# Patient Record
Sex: Male | Born: 1955
Health system: Southern US, Community
[De-identification: ages and names within clinical notes are randomized; demographics above are authoritative.]

## PROBLEM LIST (undated history)

## (undated) DIAGNOSIS — T7840XA Allergy, unspecified, initial encounter: Secondary | ICD-10-CM

## (undated) DIAGNOSIS — M1990 Unspecified osteoarthritis, unspecified site: Secondary | ICD-10-CM

## (undated) HISTORY — PX: COLONOSCOPY: SHX174

## (undated) HISTORY — PX: POLYPECTOMY: SHX149

## (undated) HISTORY — DX: Allergy, unspecified, initial encounter: T78.40XA

## (undated) HISTORY — PX: NO PAST SURGERIES: SHX2092

---

## 2015-12-10 DIAGNOSIS — Z1389 Encounter for screening for other disorder: Secondary | ICD-10-CM | POA: Diagnosis not present

## 2015-12-10 DIAGNOSIS — N401 Enlarged prostate with lower urinary tract symptoms: Secondary | ICD-10-CM | POA: Diagnosis not present

## 2015-12-10 DIAGNOSIS — Z0000 Encounter for general adult medical examination without abnormal findings: Secondary | ICD-10-CM | POA: Diagnosis not present

## 2015-12-10 DIAGNOSIS — Z9181 History of falling: Secondary | ICD-10-CM | POA: Diagnosis not present

## 2015-12-10 DIAGNOSIS — Z6837 Body mass index (BMI) 37.0-37.9, adult: Secondary | ICD-10-CM | POA: Diagnosis not present

## 2015-12-10 DIAGNOSIS — E669 Obesity, unspecified: Secondary | ICD-10-CM | POA: Diagnosis not present

## 2015-12-10 DIAGNOSIS — N529 Male erectile dysfunction, unspecified: Secondary | ICD-10-CM | POA: Diagnosis not present

## 2015-12-10 DIAGNOSIS — Z800 Family history of malignant neoplasm of digestive organs: Secondary | ICD-10-CM | POA: Diagnosis not present

## 2016-02-08 DIAGNOSIS — Z1211 Encounter for screening for malignant neoplasm of colon: Secondary | ICD-10-CM | POA: Diagnosis not present

## 2016-02-08 DIAGNOSIS — Z800 Family history of malignant neoplasm of digestive organs: Secondary | ICD-10-CM | POA: Diagnosis not present

## 2016-04-14 DIAGNOSIS — Z86010 Personal history of colonic polyps: Secondary | ICD-10-CM | POA: Diagnosis not present

## 2016-04-14 DIAGNOSIS — K648 Other hemorrhoids: Secondary | ICD-10-CM | POA: Diagnosis not present

## 2016-04-14 DIAGNOSIS — Z800 Family history of malignant neoplasm of digestive organs: Secondary | ICD-10-CM | POA: Diagnosis not present

## 2016-04-14 DIAGNOSIS — Z1211 Encounter for screening for malignant neoplasm of colon: Secondary | ICD-10-CM | POA: Diagnosis not present

## 2016-04-14 DIAGNOSIS — K5730 Diverticulosis of large intestine without perforation or abscess without bleeding: Secondary | ICD-10-CM | POA: Diagnosis not present

## 2016-04-14 HISTORY — PX: COLONOSCOPY: SHX174

## 2016-06-02 DIAGNOSIS — L821 Other seborrheic keratosis: Secondary | ICD-10-CM | POA: Diagnosis not present

## 2016-06-02 DIAGNOSIS — I781 Nevus, non-neoplastic: Secondary | ICD-10-CM | POA: Diagnosis not present

## 2016-06-02 DIAGNOSIS — L578 Other skin changes due to chronic exposure to nonionizing radiation: Secondary | ICD-10-CM | POA: Diagnosis not present

## 2016-06-09 DIAGNOSIS — N401 Enlarged prostate with lower urinary tract symptoms: Secondary | ICD-10-CM | POA: Diagnosis not present

## 2016-06-09 DIAGNOSIS — R635 Abnormal weight gain: Secondary | ICD-10-CM | POA: Diagnosis not present

## 2016-06-09 DIAGNOSIS — N529 Male erectile dysfunction, unspecified: Secondary | ICD-10-CM | POA: Diagnosis not present

## 2016-06-09 DIAGNOSIS — K429 Umbilical hernia without obstruction or gangrene: Secondary | ICD-10-CM | POA: Diagnosis not present

## 2016-06-09 DIAGNOSIS — E669 Obesity, unspecified: Secondary | ICD-10-CM | POA: Diagnosis not present

## 2016-06-09 DIAGNOSIS — Z125 Encounter for screening for malignant neoplasm of prostate: Secondary | ICD-10-CM | POA: Diagnosis not present

## 2016-06-24 DIAGNOSIS — K429 Umbilical hernia without obstruction or gangrene: Secondary | ICD-10-CM | POA: Diagnosis not present

## 2016-07-04 DIAGNOSIS — E119 Type 2 diabetes mellitus without complications: Secondary | ICD-10-CM | POA: Diagnosis not present

## 2016-07-04 DIAGNOSIS — E785 Hyperlipidemia, unspecified: Secondary | ICD-10-CM | POA: Diagnosis not present

## 2016-07-04 DIAGNOSIS — I10 Essential (primary) hypertension: Secondary | ICD-10-CM | POA: Diagnosis not present

## 2016-09-04 MED FILL — CIALIS 5 MG TABLET: 5 | 30 days supply | Qty: 30 | Fill #0

## 2016-12-25 DIAGNOSIS — N401 Enlarged prostate with lower urinary tract symptoms: Secondary | ICD-10-CM | POA: Diagnosis not present

## 2016-12-25 DIAGNOSIS — Z0000 Encounter for general adult medical examination without abnormal findings: Secondary | ICD-10-CM | POA: Diagnosis not present

## 2016-12-25 DIAGNOSIS — R635 Abnormal weight gain: Secondary | ICD-10-CM | POA: Diagnosis not present

## 2016-12-25 DIAGNOSIS — Z1389 Encounter for screening for other disorder: Secondary | ICD-10-CM | POA: Diagnosis not present

## 2016-12-25 DIAGNOSIS — N529 Male erectile dysfunction, unspecified: Secondary | ICD-10-CM | POA: Diagnosis not present

## 2016-12-25 DIAGNOSIS — Z6836 Body mass index (BMI) 36.0-36.9, adult: Secondary | ICD-10-CM | POA: Diagnosis not present

## 2017-01-23 DIAGNOSIS — M25562 Pain in left knee: Secondary | ICD-10-CM | POA: Diagnosis not present

## 2017-01-27 ENCOUNTER — Other Ambulatory Visit (HOSPITAL_COMMUNITY): Payer: Self-pay | Admitting: Internal Medicine

## 2017-01-27 ENCOUNTER — Ambulatory Visit (HOSPITAL_COMMUNITY)
Admission: RE | Admit: 2017-01-27 | Discharge: 2017-01-27 | Disposition: A | Discharge status: Home / Self Care | Payer: 59 | Source: Ambulatory Visit | Attending: Internal Medicine | Admitting: Internal Medicine

## 2017-01-27 DIAGNOSIS — M85862 Other specified disorders of bone density and structure, left lower leg: Secondary | ICD-10-CM | POA: Insufficient documentation

## 2017-01-27 DIAGNOSIS — M25562 Pain in left knee: Secondary | ICD-10-CM | POA: Insufficient documentation

## 2017-01-27 DIAGNOSIS — M179 Osteoarthritis of knee, unspecified: Secondary | ICD-10-CM | POA: Diagnosis not present

## 2017-01-27 DIAGNOSIS — M25462 Effusion, left knee: Secondary | ICD-10-CM | POA: Insufficient documentation

## 2017-06-26 ENCOUNTER — Telehealth: Payer: Self-pay

## 2017-06-26 NOTE — Telephone Encounter (Signed)
Requesting date for next colonoscopy.  Please advise.  Thank you.

## 2017-06-30 NOTE — Telephone Encounter (Signed)
Rpt colon Jan 2023 Last one was on 04/14/2016

## 2017-07-01 NOTE — Telephone Encounter (Signed)
I left a message for the patient with the date of his next colonoscopy.  A recall was also entered in [facility].

## 2017-07-28 DIAGNOSIS — Z0000 Encounter for general adult medical examination without abnormal findings: Secondary | ICD-10-CM | POA: Diagnosis not present

## 2017-07-28 DIAGNOSIS — N529 Male erectile dysfunction, unspecified: Secondary | ICD-10-CM | POA: Diagnosis not present

## 2017-07-28 DIAGNOSIS — R635 Abnormal weight gain: Secondary | ICD-10-CM | POA: Diagnosis not present

## 2017-07-28 DIAGNOSIS — Z6837 Body mass index (BMI) 37.0-37.9, adult: Secondary | ICD-10-CM | POA: Diagnosis not present

## 2017-07-28 DIAGNOSIS — N401 Enlarged prostate with lower urinary tract symptoms: Secondary | ICD-10-CM | POA: Diagnosis not present

## 2017-09-03 DIAGNOSIS — J208 Acute bronchitis due to other specified organisms: Secondary | ICD-10-CM | POA: Diagnosis not present

## 2018-01-28 DIAGNOSIS — Z0000 Encounter for general adult medical examination without abnormal findings: Secondary | ICD-10-CM | POA: Diagnosis not present

## 2018-01-28 DIAGNOSIS — E669 Obesity, unspecified: Secondary | ICD-10-CM | POA: Diagnosis not present

## 2018-01-28 DIAGNOSIS — R635 Abnormal weight gain: Secondary | ICD-10-CM | POA: Diagnosis not present

## 2018-01-28 DIAGNOSIS — N401 Enlarged prostate with lower urinary tract symptoms: Secondary | ICD-10-CM | POA: Diagnosis not present

## 2018-01-28 DIAGNOSIS — M170 Bilateral primary osteoarthritis of knee: Secondary | ICD-10-CM | POA: Diagnosis not present

## 2018-01-28 DIAGNOSIS — Z1331 Encounter for screening for depression: Secondary | ICD-10-CM | POA: Diagnosis not present

## 2018-01-28 DIAGNOSIS — N529 Male erectile dysfunction, unspecified: Secondary | ICD-10-CM | POA: Diagnosis not present

## 2018-01-28 DIAGNOSIS — Z1339 Encounter for screening examination for other mental health and behavioral disorders: Secondary | ICD-10-CM | POA: Diagnosis not present

## 2018-03-03 MED FILL — CELECOXIB 200 MG CAP: 200 | 90 days supply | Qty: 180 | Fill #0

## 2018-06-17 MED FILL — CELECOXIB 200 MG CAPSULE: 200 | 90 days supply | Qty: 180 | Fill #0

## 2018-09-17 MED FILL — CELECOXIB 200 MG CAP: 200 | 90 days supply | Qty: 180 | Fill #0

## 2018-10-27 DIAGNOSIS — N529 Male erectile dysfunction, unspecified: Secondary | ICD-10-CM | POA: Diagnosis not present

## 2018-10-27 DIAGNOSIS — Z0000 Encounter for general adult medical examination without abnormal findings: Secondary | ICD-10-CM | POA: Diagnosis not present

## 2018-10-27 DIAGNOSIS — N401 Enlarged prostate with lower urinary tract symptoms: Secondary | ICD-10-CM | POA: Diagnosis not present

## 2018-10-27 DIAGNOSIS — M170 Bilateral primary osteoarthritis of knee: Secondary | ICD-10-CM | POA: Diagnosis not present

## 2018-10-27 DIAGNOSIS — R635 Abnormal weight gain: Secondary | ICD-10-CM | POA: Diagnosis not present

## 2018-10-27 DIAGNOSIS — R7301 Impaired fasting glucose: Secondary | ICD-10-CM | POA: Diagnosis not present

## 2019-01-06 MED FILL — CELECOXIB 200 MG CAP: 200 | 90 days supply | Qty: 180 | Fill #0

## 2019-01-17 MED FILL — CELECOXIB 200 MG CAP: 200 | 90 days supply | Qty: 180 | Fill #0

## 2019-04-15 MED FILL — CELECOXIB 200 MG CAP: 200 | 90 days supply | Qty: 180 | Fill #1

## 2019-06-15 DIAGNOSIS — G4489 Other headache syndrome: Secondary | ICD-10-CM | POA: Diagnosis not present

## 2019-06-15 DIAGNOSIS — M170 Bilateral primary osteoarthritis of knee: Secondary | ICD-10-CM | POA: Diagnosis not present

## 2019-06-15 DIAGNOSIS — Z1331 Encounter for screening for depression: Secondary | ICD-10-CM | POA: Diagnosis not present

## 2019-06-15 DIAGNOSIS — R635 Abnormal weight gain: Secondary | ICD-10-CM | POA: Diagnosis not present

## 2019-06-15 DIAGNOSIS — R7301 Impaired fasting glucose: Secondary | ICD-10-CM | POA: Diagnosis not present

## 2019-07-04 DIAGNOSIS — M25561 Pain in right knee: Secondary | ICD-10-CM | POA: Diagnosis not present

## 2019-07-04 DIAGNOSIS — M170 Bilateral primary osteoarthritis of knee: Secondary | ICD-10-CM | POA: Diagnosis not present

## 2019-07-04 DIAGNOSIS — M1712 Unilateral primary osteoarthritis, left knee: Secondary | ICD-10-CM | POA: Diagnosis not present

## 2019-07-04 DIAGNOSIS — M1711 Unilateral primary osteoarthritis, right knee: Secondary | ICD-10-CM | POA: Diagnosis not present

## 2019-07-04 DIAGNOSIS — M25562 Pain in left knee: Secondary | ICD-10-CM | POA: Diagnosis not present

## 2019-07-14 ENCOUNTER — Other Ambulatory Visit (HOSPITAL_COMMUNITY): Payer: Self-pay | Admitting: Internal Medicine

## 2019-07-14 MED FILL — CELECOXIB 200 MG CAP: 200 | 90 days supply | Qty: 180 | Fill #0

## 2019-10-10 MED FILL — CELECOXIB 200 MG CAP: 200 | 90 days supply | Qty: 180 | Fill #1

## 2019-11-15 DIAGNOSIS — G4733 Obstructive sleep apnea (adult) (pediatric): Secondary | ICD-10-CM | POA: Diagnosis not present

## 2019-11-15 DIAGNOSIS — M170 Bilateral primary osteoarthritis of knee: Secondary | ICD-10-CM | POA: Diagnosis not present

## 2019-11-15 DIAGNOSIS — R7301 Impaired fasting glucose: Secondary | ICD-10-CM | POA: Diagnosis not present

## 2019-11-15 DIAGNOSIS — R635 Abnormal weight gain: Secondary | ICD-10-CM | POA: Diagnosis not present

## 2019-11-15 DIAGNOSIS — Z0000 Encounter for general adult medical examination without abnormal findings: Secondary | ICD-10-CM | POA: Diagnosis not present

## 2019-11-15 DIAGNOSIS — Z79899 Other long term (current) drug therapy: Secondary | ICD-10-CM | POA: Diagnosis not present

## 2019-11-15 DIAGNOSIS — E669 Obesity, unspecified: Secondary | ICD-10-CM | POA: Diagnosis not present

## 2019-12-14 DIAGNOSIS — N401 Enlarged prostate with lower urinary tract symptoms: Secondary | ICD-10-CM | POA: Diagnosis not present

## 2019-12-14 DIAGNOSIS — N529 Male erectile dysfunction, unspecified: Secondary | ICD-10-CM | POA: Diagnosis not present

## 2020-01-12 MED FILL — CELECOXIB 200 MG CAP: 200 | 90 days supply | Qty: 180 | Fill #2

## 2020-04-18 ENCOUNTER — Other Ambulatory Visit (HOSPITAL_COMMUNITY): Payer: Self-pay | Admitting: Internal Medicine

## 2020-04-18 MED FILL — CELECOXIB 200 MG CAP: 200 | 90 days supply | Qty: 180 | Fill #0

## 2020-07-12 DIAGNOSIS — J301 Allergic rhinitis due to pollen: Secondary | ICD-10-CM | POA: Diagnosis not present

## 2020-07-25 ENCOUNTER — Other Ambulatory Visit (HOSPITAL_COMMUNITY): Payer: Self-pay

## 2020-07-25 MED ORDER — CELECOXIB 200 MG PO CAPS
ORAL_CAPSULE | ORAL | 0 refills | Status: DC
  Filled 2020-07-25: qty 180, 90d supply, fill #0

## 2020-09-19 DIAGNOSIS — Z1331 Encounter for screening for depression: Secondary | ICD-10-CM | POA: Diagnosis not present

## 2020-09-19 DIAGNOSIS — Z0000 Encounter for general adult medical examination without abnormal findings: Secondary | ICD-10-CM | POA: Diagnosis not present

## 2020-09-19 DIAGNOSIS — N401 Enlarged prostate with lower urinary tract symptoms: Secondary | ICD-10-CM | POA: Diagnosis not present

## 2020-09-19 DIAGNOSIS — N529 Male erectile dysfunction, unspecified: Secondary | ICD-10-CM | POA: Diagnosis not present

## 2020-10-02 ENCOUNTER — Other Ambulatory Visit (HOSPITAL_COMMUNITY): Payer: Self-pay

## 2020-10-02 MED ORDER — CIPROFLOXACIN HCL 500 MG PO TABS
ORAL_TABLET | ORAL | 0 refills | Status: DC
  Filled 2020-10-02 – 2020-10-23 (×2): qty 10, 5d supply, fill #0

## 2020-10-10 ENCOUNTER — Other Ambulatory Visit (HOSPITAL_COMMUNITY): Payer: Self-pay

## 2020-10-23 ENCOUNTER — Other Ambulatory Visit (HOSPITAL_COMMUNITY): Payer: Self-pay

## 2020-11-06 ENCOUNTER — Other Ambulatory Visit (HOSPITAL_COMMUNITY): Payer: Self-pay

## 2020-11-06 MED ORDER — CELECOXIB 200 MG PO CAPS
200.0000 mg | ORAL_CAPSULE | Freq: Two times a day (BID) | ORAL | 3 refills | Status: DC
  Filled 2020-11-06: qty 180, 90d supply, fill #0

## 2020-12-12 DIAGNOSIS — R972 Elevated prostate specific antigen [PSA]: Secondary | ICD-10-CM | POA: Diagnosis not present

## 2020-12-12 DIAGNOSIS — N401 Enlarged prostate with lower urinary tract symptoms: Secondary | ICD-10-CM | POA: Diagnosis not present

## 2020-12-19 ENCOUNTER — Other Ambulatory Visit (HOSPITAL_COMMUNITY): Payer: Self-pay

## 2020-12-19 DIAGNOSIS — N401 Enlarged prostate with lower urinary tract symptoms: Secondary | ICD-10-CM | POA: Diagnosis not present

## 2020-12-19 DIAGNOSIS — R972 Elevated prostate specific antigen [PSA]: Secondary | ICD-10-CM | POA: Diagnosis not present

## 2020-12-19 DIAGNOSIS — N529 Male erectile dysfunction, unspecified: Secondary | ICD-10-CM | POA: Diagnosis not present

## 2020-12-19 MED ORDER — TADALAFIL 5 MG PO TABS
5.0000 mg | ORAL_TABLET | Freq: Every day | ORAL | 3 refills | Status: DC
  Filled 2020-12-19: qty 90, 90d supply, fill #0

## 2021-01-13 DIAGNOSIS — M549 Dorsalgia, unspecified: Secondary | ICD-10-CM | POA: Diagnosis not present

## 2021-01-13 DIAGNOSIS — R300 Dysuria: Secondary | ICD-10-CM | POA: Diagnosis not present

## 2021-03-28 ENCOUNTER — Other Ambulatory Visit (HOSPITAL_COMMUNITY): Payer: Self-pay

## 2021-03-28 MED ORDER — CELECOXIB 200 MG PO CAPS
200.0000 mg | ORAL_CAPSULE | Freq: Two times a day (BID) | ORAL | 3 refills | Status: DC
  Filled 2021-03-28: qty 180, 90d supply, fill #0

## 2021-04-30 ENCOUNTER — Other Ambulatory Visit (HOSPITAL_COMMUNITY): Payer: Self-pay

## 2021-05-22 ENCOUNTER — Encounter: Payer: Self-pay | Admitting: Gastroenterology

## 2021-06-27 DIAGNOSIS — R972 Elevated prostate specific antigen [PSA]: Secondary | ICD-10-CM | POA: Diagnosis not present

## 2021-06-27 DIAGNOSIS — N401 Enlarged prostate with lower urinary tract symptoms: Secondary | ICD-10-CM | POA: Diagnosis not present

## 2021-06-27 DIAGNOSIS — N529 Male erectile dysfunction, unspecified: Secondary | ICD-10-CM | POA: Diagnosis not present

## 2021-07-11 ENCOUNTER — Other Ambulatory Visit (HOSPITAL_COMMUNITY): Payer: Self-pay

## 2021-07-11 MED ORDER — CELECOXIB 200 MG PO CAPS
200.0000 mg | ORAL_CAPSULE | Freq: Two times a day (BID) | ORAL | 3 refills | Status: DC
  Filled 2021-07-11: qty 180, 90d supply, fill #0
  Filled 2021-10-21: qty 180, 90d supply, fill #1
  Filled 2022-02-07: qty 180, 90d supply, fill #2
  Filled 2022-05-26: qty 180, 90d supply, fill #3

## 2021-07-16 ENCOUNTER — Other Ambulatory Visit: Payer: Self-pay | Admitting: Urology

## 2021-07-16 DIAGNOSIS — R972 Elevated prostate specific antigen [PSA]: Secondary | ICD-10-CM

## 2021-07-23 ENCOUNTER — Telehealth: Payer: Self-pay | Admitting: Gastroenterology

## 2021-07-23 ENCOUNTER — Encounter: Payer: Self-pay | Admitting: Gastroenterology

## 2021-08-06 NOTE — Telephone Encounter (Signed)
Needs recall colonoscopy ?RG ?

## 2021-08-07 NOTE — Telephone Encounter (Signed)
Left message for pt to call back  °

## 2021-08-08 NOTE — Telephone Encounter (Signed)
Pt previously scheduled for a colonoscopy on 10/11/2021 at 8:30 with Dr. Lyndel Safe. Pt already aware Pt previously scheduled for a previsit appt in person on 09/13/2021 at 4:00. Pt already aware: Pt verbalized understanding with all questions answered.

## 2021-08-09 ENCOUNTER — Ambulatory Visit
Admission: RE | Admit: 2021-08-09 | Discharge: 2021-08-09 | Disposition: A | Discharge status: Home / Self Care | Payer: 59 | Source: Ambulatory Visit | Attending: Urology | Admitting: Urology

## 2021-08-09 DIAGNOSIS — R972 Elevated prostate specific antigen [PSA]: Secondary | ICD-10-CM

## 2021-08-09 MED ORDER — GADOBENATE DIMEGLUMINE 529 MG/ML IV SOLN
20.0000 mL | Freq: Once | INTRAVENOUS | Status: AC | PRN
  Administered 2021-08-09: 20 mL via INTRAVENOUS

## 2021-09-13 ENCOUNTER — Ambulatory Visit (AMBULATORY_SURGERY_CENTER): Payer: Self-pay | Admitting: *Deleted

## 2021-09-13 VITALS — Ht 75.0 in | Wt 300.0 lb

## 2021-09-13 DIAGNOSIS — Z86010 Personal history of colonic polyps: Secondary | ICD-10-CM

## 2021-09-13 DIAGNOSIS — Z800 Family history of malignant neoplasm of digestive organs: Secondary | ICD-10-CM

## 2021-09-13 MED ORDER — NA SULFATE-K SULFATE-MG SULF 17.5-3.13-1.6 GM/177ML PO SOLN: 1.0000 | Freq: Once | ORAL | 0 refills | Status: AC

## 2021-09-13 NOTE — Progress Notes (Signed)
No egg or soy allergy known to patient  No issues known to pt with past sedation with any surgeries or procedures Patient denies ever being told they had issues or difficulty with intubation  No FH of Malignant Hyperthermia Pt is not on diet pills Pt is not on  home 02  Pt is not on blood thinners  Pt denies issues with constipation  No A fib or A flutter  NO PA's for preps discussed with pt In PV today  Discussed with pt there will be an out-of-pocket cost for prep and that varies from $0 to 70 +  dollars - pt verbalized understanding  Pt instructed to use Singlecare.com or GoodRx for a price reduction on prep   PT DOES NOT WANT SEDATION FOR HIS COLON-  pt states last colon no sedation-  pt aware he has to have an IV and  driver

## 2021-10-02 DIAGNOSIS — M170 Bilateral primary osteoarthritis of knee: Secondary | ICD-10-CM | POA: Diagnosis not present

## 2021-10-08 ENCOUNTER — Encounter: Payer: Self-pay | Admitting: Gastroenterology

## 2021-10-11 ENCOUNTER — Encounter: Payer: Self-pay | Admitting: Gastroenterology

## 2021-10-11 ENCOUNTER — Ambulatory Visit (AMBULATORY_SURGERY_CENTER): Payer: 59 | Admitting: Gastroenterology

## 2021-10-11 VITALS — BP 145/71 | HR 54 | Temp 97.7°F | Resp 15 | Ht 75.0 in | Wt 300.0 lb

## 2021-10-11 DIAGNOSIS — Z800 Family history of malignant neoplasm of digestive organs: Secondary | ICD-10-CM

## 2021-10-11 DIAGNOSIS — Z09 Encounter for follow-up examination after completed treatment for conditions other than malignant neoplasm: Secondary | ICD-10-CM | POA: Diagnosis not present

## 2021-10-11 DIAGNOSIS — Z86010 Personal history of colonic polyps: Secondary | ICD-10-CM

## 2021-10-11 DIAGNOSIS — D123 Benign neoplasm of transverse colon: Secondary | ICD-10-CM

## 2021-10-11 MED ORDER — SODIUM CHLORIDE 0.9 % IV SOLN: 500.0000 mL | INTRAVENOUS | Status: DC

## 2021-10-11 NOTE — Op Note (Signed)
[facility] Patient Name: Robert Knapp Procedure Date: 10/11/2021 8:30 AM MRN: [id number] Endoscopist: Jackquline Denmark , MD Age: 66 Referring MD:  Date of Birth: 05-Feb-1956 Gender: Male Account #: [id number] Procedure:                Colonoscopy Indications:              Screening in patient at increased risk: FH CRC                            (mom) at age 58. H/O polyps Medicines:                None Procedure:                Pre-Anesthesia Assessment:                           - Prior to the procedure, a History and Physical                            was performed, and patient medications and                            allergies were reviewed. The patient's tolerance of                            previous anesthesia was also reviewed. The risks                            and benefits of the procedure and the sedation                            options and risks were discussed with the patient.                            All questions were answered, and informed consent                            was obtained. Prior Anticoagulants: The patient has                            taken no previous anticoagulant or antiplatelet                            agents. ASA Grade Assessment: II - A patient with                            mild systemic disease. After reviewing the risks                            and benefits, the patient was deemed in                            satisfactory condition to undergo the procedure.  After obtaining informed consent, the colonoscope                            was passed under direct vision. Throughout the                            procedure, the patient's blood pressure, pulse, and                            oxygen saturations were monitored continuously. The                            Colonoscope was introduced through the anus and                            advanced to the the cecum, identified by                             appendiceal orifice and ileocecal valve. The                            colonoscopy was performed without difficulty. The                            patient tolerated the procedure well. The quality                            of the bowel preparation was good. The ileocecal                            valve, appendiceal orifice, and rectum were                            photographed. Scope In: 8:37:58 AM Scope Out: 8:58:25 AM Scope Withdrawal Time: 0 hours 9 minutes 58 seconds  Total Procedure Duration: 0 hours 20 minutes 27 seconds  Findings:                 Two sessile polyps were found in the proximal                            transverse colon and mid transverse colon. The                            polyps were 6 mm in size. These polyps were removed                            with a cold snare. Resection and retrieval were                            complete.                           A few small-mouthed diverticula were found in the  sigmoid colon.                           Non-bleeding internal hemorrhoids were found during                            retroflexion. The hemorrhoids were small and Grade                            I (internal hemorrhoids that do not prolapse).                           The exam was otherwise without abnormality on                            direct and retroflexion views. Complications:            No immediate complications. Estimated Blood Loss:     Estimated blood loss: none. Impression:               - Two 6 mm polyps in the proximal transverse colon                            and in the mid transverse colon, removed with a                            cold snare. Resected and retrieved.                           - Mild sigmoid diverticulosis.                           - Non-bleeding internal hemorrhoids.                           - The examination was otherwise normal on direct                            and  retroflexion views. Recommendation:           - Patient has a contact number available for                            emergencies. The signs and symptoms of potential                            delayed complications were discussed with the                            patient. Return to normal activities tomorrow.                            Written discharge instructions were provided to the                            patient.                           -  Resume previous diet.                           - Continue present medications.                           - Await pathology results.                           - Repeat colonoscopy in 5 years for surveillance.                            Earlier, if with any new problems or change in                            family history.                           - The findings and recommendations were discussed                            with the patient's family. Jackquline Denmark, MD 10/11/2021 9:02:49 AM This report has been signed electronically.

## 2021-10-11 NOTE — Progress Notes (Signed)
PT taken to PACU. Monitors in place. VSS. Report given to RN. 

## 2021-10-11 NOTE — Patient Instructions (Signed)
Handouts on hemrrhoids, diverticulosis, and polyps given to patient.  Await pathology results. Resume previous diet and continue present medications. Repeat colonoscopy for surveillance in 5 years unless new symptoms or problems arise.    YOU HAD AN ENDOSCOPIC PROCEDURE TODAY AT [facility]:   Refer to the procedure report that was given to you for any specific questions about what was found during the examination.  If the procedure report does not answer your questions, please call your gastroenterologist to clarify.  If you requested that your care partner not be given the details of your procedure findings, then the procedure report has been included in a sealed envelope for you to review at your convenience later.  YOU SHOULD EXPECT: Some feelings of bloating in the abdomen. Passage of more gas than usual.  Walking can help get rid of the air that was put into your GI tract during the procedure and reduce the bloating. If you had a lower endoscopy (such as a colonoscopy or flexible sigmoidoscopy) you may notice spotting of blood in your stool or on the toilet paper. If you underwent a bowel prep for your procedure, you may not have a normal bowel movement for a few days.  Please Note:  You might notice some irritation and congestion in your nose or some drainage.  This is from the oxygen used during your procedure.  There is no need for concern and it should clear up in a day or so.  SYMPTOMS TO REPORT IMMEDIATELY:  Following lower endoscopy (colonoscopy or flexible sigmoidoscopy):  Excessive amounts of blood in the stool  Significant tenderness or worsening of abdominal pains  Swelling of the abdomen that is new, acute  Fever of 100F or higher  For urgent or emergent issues, a gastroenterologist can be reached at any hour by calling [phone]. Do not use MyChart messaging for urgent concerns.    DIET:  We do recommend a small meal at first, but then you may  proceed to your regular diet.  Drink plenty of fluids but you should avoid alcoholic beverages for 24 hours.  ACTIVITY:  You should plan to take it easy for the rest of today and you should NOT DRIVE or use heavy machinery until tomorrow (because of the sedation medicines used during the test).    FOLLOW UP: Our staff will call the number listed on your records the next business day following your procedure.  We will call around 7:15- 8:00 am to check on you and address any questions or concerns that you may have regarding the information given to you following your procedure. If we do not reach you, we will leave a message.  If you develop any symptoms (ie: fever, flu-like symptoms, shortness of breath, cough etc.) before then, please call [phone].  If you test positive for Covid 19 in the 2 weeks post procedure, please call and report this information to [place].    If any biopsies were taken you will be contacted by phone or by letter within the next 1-3 weeks.  Please call us at [phone] if you have not heard about the biopsies in 3 weeks.    SIGNATURES/CONFIDENTIALITY: You and/or your care partner have signed paperwork which will be entered into your electronic medical record.  These signatures attest to the fact that that the information above on your After Visit Summary has been reviewed and is understood.  Full responsibility of the confidentiality of this discharge information lies with you and/or  your care-partner.

## 2021-10-11 NOTE — Progress Notes (Signed)
[name] History and Physical   Primary Care Physician:  Nicoletta Dress, MD   Reason for Procedure:    family history of CRC (mom  at age 64),  history of polyps  Plan:    Colonoscopy     HPI: Robert Knapp is a 66 y.o. male   No nausea, vomiting, heartburn, regurgitation, odynophagia or dysphagia.  No significant diarrhea or constipation.  No melena or hematochezia. No unintentional weight loss. No abdominal pain.  Past Medical History:  Diagnosis Date   Allergy     Past Surgical History:  Procedure Laterality Date   COLONOSCOPY     POLYPECTOMY      Prior to Admission medications   Medication Sig Start Date End Date Taking? Authorizing Provider  celecoxib (CELEBREX) 200 MG capsule Take 1 capsule by mouth 2 times daily. 07/11/21  Yes   phentermine (ADIPEX-P) 37.5 MG tablet Take 37.5 mg by mouth daily. 08/14/21   [provider]  tadalafil (CIALIS) 5 MG tablet Take 1 tablet (5 mg total) by mouth daily. 12/19/20       Current Outpatient Medications  Medication Sig Dispense Refill   celecoxib (CELEBREX) 200 MG capsule Take 1 capsule by mouth 2 times daily. 180 capsule 3   phentermine (ADIPEX-P) 37.5 MG tablet Take 37.5 mg by mouth daily.     tadalafil (CIALIS) 5 MG tablet Take 1 tablet (5 mg total) by mouth daily. 90 tablet 3   Current Facility-Administered Medications  Medication Dose Route Frequency Provider Last Rate Last Admin   0.9 %  sodium chloride infusion  500 mL Intravenous Continuous Jackquline Denmark, MD        Allergies as of 10/11/2021   (No Known Allergies)    Family History  Problem Relation Age of Onset   Heart disease Mother    Colon cancer Mother 36   Colon polyps Neg Hx    Esophageal cancer Neg Hx    Rectal cancer Neg Hx    Stomach cancer Neg Hx     Social History   Socioeconomic History   Marital status: Married    Spouse name: Not on file   Number of children: Not on file   Years of education: Not on file    Highest education level: Not on file  Occupational History   Not on file  Tobacco Use   Smoking status: Never   Smokeless tobacco: Never  Substance and Sexual Activity   Alcohol use: Not Currently   Drug use: Not Currently   Sexual activity: Not on file  Other Topics Concern   Not on file  Social History Narrative   Not on file   Social Determinants of Health   Financial Resource Strain: Not on file  Food Insecurity: Not on file  Transportation Needs: Not on file  Physical Activity: Not on file  Stress: Not on file  Social Connections: Not on file  Intimate Partner Violence: Not on file    Review of Systems: Positive for none All other review of systems negative except as mentioned in the HPI.  Physical Exam: Vital signs in last 24 hours: '@VSRANGES'$ @   General:   Alert,  Well-developed, well-nourished, pleasant and cooperative in NAD Lungs:  Clear throughout to auscultation.   Heart:  Regular rate and rhythm; no murmurs, clicks, rubs,  or gallops. Abdomen:  Soft, nontender and nondistended. Normal bowel sounds.   Neuro/Psych:  Alert and cooperative. Normal mood and affect. A and O x 3  No significant changes were identified.    he wants to get it done without sedation.   Carmell Austria, MD. [facility] 10/11/2021 8:34 AM@

## 2021-10-11 NOTE — Progress Notes (Signed)
Called to room to assist during endoscopic procedure.  Patient ID and intended procedure confirmed with present staff. Received instructions for my participation in the procedure from the performing physician.  

## 2021-10-14 ENCOUNTER — Telehealth: Payer: Self-pay

## 2021-10-14 NOTE — Telephone Encounter (Signed)
Attempted f/u call. No answer, left VM. 

## 2021-10-17 ENCOUNTER — Encounter: Payer: Self-pay | Admitting: Gastroenterology

## 2021-10-21 ENCOUNTER — Other Ambulatory Visit (HOSPITAL_COMMUNITY): Payer: Self-pay

## 2021-10-29 ENCOUNTER — Other Ambulatory Visit (HOSPITAL_COMMUNITY): Payer: Self-pay

## 2021-12-03 DIAGNOSIS — Z0000 Encounter for general adult medical examination without abnormal findings: Secondary | ICD-10-CM | POA: Diagnosis not present

## 2021-12-03 DIAGNOSIS — Z1331 Encounter for screening for depression: Secondary | ICD-10-CM | POA: Diagnosis not present

## 2022-01-06 DIAGNOSIS — N401 Enlarged prostate with lower urinary tract symptoms: Secondary | ICD-10-CM | POA: Diagnosis not present

## 2022-01-06 DIAGNOSIS — R972 Elevated prostate specific antigen [PSA]: Secondary | ICD-10-CM | POA: Diagnosis not present

## 2022-01-06 DIAGNOSIS — N529 Male erectile dysfunction, unspecified: Secondary | ICD-10-CM | POA: Diagnosis not present

## 2022-02-07 ENCOUNTER — Other Ambulatory Visit (HOSPITAL_COMMUNITY): Payer: Self-pay

## 2022-02-24 DIAGNOSIS — M170 Bilateral primary osteoarthritis of knee: Secondary | ICD-10-CM | POA: Diagnosis not present

## 2022-04-07 DIAGNOSIS — Z6841 Body Mass Index (BMI) 40.0 and over, adult: Secondary | ICD-10-CM | POA: Diagnosis not present

## 2022-04-07 DIAGNOSIS — M170 Bilateral primary osteoarthritis of knee: Secondary | ICD-10-CM | POA: Diagnosis not present

## 2022-05-01 ENCOUNTER — Other Ambulatory Visit (HOSPITAL_COMMUNITY): Payer: Self-pay

## 2022-05-19 DIAGNOSIS — M170 Bilateral primary osteoarthritis of knee: Secondary | ICD-10-CM | POA: Diagnosis not present

## 2022-05-26 ENCOUNTER — Other Ambulatory Visit (HOSPITAL_COMMUNITY): Payer: Self-pay

## 2022-06-16 DIAGNOSIS — M1711 Unilateral primary osteoarthritis, right knee: Secondary | ICD-10-CM | POA: Diagnosis not present

## 2022-06-16 DIAGNOSIS — M25661 Stiffness of right knee, not elsewhere classified: Secondary | ICD-10-CM | POA: Diagnosis not present

## 2022-06-16 DIAGNOSIS — M25561 Pain in right knee: Secondary | ICD-10-CM | POA: Diagnosis not present

## 2022-07-08 ENCOUNTER — Ambulatory Visit: Payer: Self-pay | Admitting: Student

## 2022-07-08 NOTE — Progress Notes (Signed)
Surgery orders requested via Epic inbox. °

## 2022-07-11 NOTE — Patient Instructions (Signed)
SURGICAL WAITING ROOM VISITATION  Patients having surgery or a procedure may have no more than 2 support people in the waiting area - these visitors may rotate.    Children under the age of 80 must have an adult with them who is not the patient.  Due to an increase in RSV and influenza rates and associated hospitalizations, children ages 91 and under may not visit patients in [facility] hospitals.  If the patient needs to stay at the hospital during part of their recovery, the visitor guidelines for inpatient rooms apply. Pre-op nurse will coordinate an appropriate time for 1 support person to accompany patient in pre-op.  This support person may not rotate.    Please refer to the [facility] website for the visitor guidelines for Inpatients (after your surgery is over and you are in a regular room).       Your procedure is scheduled on: 07-24-22   Report to [facility] Main Entrance    Report to admitting at      0515   AM   Call this number if you have problems the morning of surgery [phone]   Do not eat food :After Midnight.   After Midnight you may have the following liquids until _0430_____ AM DAY OF SURGERY  Water Non-Citrus Juices (without pulp, NO RED-Apple, White grape, White cranberry) Black Coffee (NO MILK/CREAM OR CREAMERS, sugar ok)  Clear Tea (NO MILK/CREAM OR CREAMERS, sugar ok) regular and decaf                             Plain Jell-O (NO RED)                                           Fruit ices (not with fruit pulp, NO RED)                                     Popsicles (NO RED)                                                               Sports drinks like Gatorade (NO RED)                     The day of surgery:  Drink ONE (1) Pre-Surgery Clear Ensure or G2 at     0415  AM the morning of surgery. Drink in one sitting. Do not sip.  This drink was given to you during your hospital  pre-op appointment visit. Nothing else to drink after  completing the  Pre-Surgery Clear Ensure or G2.by 0430 am          If you have questions, please contact your surgeon's office.   FOLLOW  ANY ADDITIONAL PRE OP INSTRUCTIONS YOU RECEIVED FROM YOUR SURGEON'S OFFICE!!!     Oral Hygiene is also important to reduce your risk of infection.  Remember - BRUSH YOUR TEETH THE MORNING OF SURGERY WITH YOUR REGULAR TOOTHPASTE  DENTURES WILL BE REMOVED PRIOR TO SURGERY PLEASE DO NOT APPLY "Poly grip" OR ADHESIVES!!!   Do NOT smoke after Midnight   Take these medicines the morning of surgery with A SIP OF WATER: NONE  DO NOT TAKE ANY ORAL DIABETIC MEDICATIONS DAY OF YOUR SURGERY  Bring CPAP mask and tubing day of surgery.                              You may not have any metal on your body including hair pins, jewelry, and body piercing             Do not wear lotions, powders, perfumes/cologne, or deodorant              Men may shave face and neck.   Do not bring valuables to the hospital. [facility] IS NOT             RESPONSIBLE   FOR VALUABLES.   Contacts, glasses, dentures or bridgework may not be worn into surgery.   Bring small overnight bag day of surgery.   DO NOT BRING YOUR HOME MEDICATIONS TO THE HOSPITAL. PHARMACY WILL DISPENSE MEDICATIONS LISTED ON YOUR MEDICATION LIST TO YOU DURING YOUR ADMISSION IN THE HOSPITAL!    Patients discharged on the day of surgery will not be allowed to drive home.  Someone NEEDS to stay with you for the first 24 hours after anesthesia.   Special Instructions: Bring a copy of your healthcare power of attorney and living will documents the day of surgery if you haven't scanned them before.              Please read over the following fact sheets you were given: IF YOU HAVE QUESTIONS ABOUT YOUR PRE-OP INSTRUCTIONS PLEASE CALL [phone]    If you test positive for Covid or have been in contact with anyone that has tested positive in the last 10 days please notify  you surgeon.     PLEASE FOLLOW ATTACHED SHOWER INSTRUCTIONS  Incentive Spirometer  An incentive spirometer is a tool that can help keep your lungs clear and active. This tool measures how well you are filling your lungs with each breath. Taking long deep breaths may help reverse or decrease the chance of developing breathing (pulmonary) problems (especially infection) following: A long period of time when you are unable to move or be active. BEFORE THE PROCEDURE  If the spirometer includes an indicator to show your best effort, your nurse or respiratory therapist will set it to a desired goal. If possible, sit up straight or lean slightly forward. Try not to slouch. Hold the incentive spirometer in an upright position. INSTRUCTIONS FOR USE  Sit on the edge of your bed if possible, or sit up as far as you can in bed or on a chair. Hold the incentive spirometer in an upright position. Breathe out normally. Place the mouthpiece in your mouth and seal your lips tightly around it. Breathe in slowly and as deeply as possible, raising the piston or the ball toward the top of the column. Hold your breath for 3-5 seconds or for as long as possible. Allow the piston or ball to fall to the bottom of the column. Remove the mouthpiece from your mouth and breathe out normally. Rest for a few seconds and repeat Steps 1 through 7 at least 10  times every 1-2 hours when you are awake. Take your time and take a few normal breaths between deep breaths. The spirometer may include an indicator to show your best effort. Use the indicator as a goal to work toward during each repetition. After each set of 10 deep breaths, practice coughing to be sure your lungs are clear. If you have an incision (the cut made at the time of surgery), support your incision when coughing by placing a pillow or rolled up towels firmly against it. Once you are able to get out of bed, walk around indoors and cough well. You may stop using  the incentive spirometer when instructed by your caregiver.  RISKS AND COMPLICATIONS Take your time so you do not get dizzy or light-headed. If you are in pain, you may need to take or ask for pain medication before doing incentive spirometry. It is harder to take a deep breath if you are having pain. AFTER USE Rest and breathe slowly and easily. It can be helpful to keep track of a log of your progress. Your caregiver can provide you with a simple table to help with this. If you are using the spirometer at home, follow these instructions: SEEK MEDICAL CARE IF:  You are having difficultly using the spirometer. You have trouble using the spirometer as often as instructed. Your pain medication is not giving enough relief while using the spirometer. You develop fever of 100.5 F (38.1 C) or higher. SEEK IMMEDIATE MEDICAL CARE IF:  You cough up bloody sputum that had not been present before. You develop fever of 102 F (38.9 C) or greater. You develop worsening pain at or near the incision site. MAKE SURE YOU:  Understand these instructions. Will watch your condition. Will get help right away if you are not doing well or get worse. Document Released: 07/21/2006 Document Revised: 06/02/2011 Document Reviewed: 09/21/2006 [facility] Patient Information 2014 [place], [place].   ________________________________________________________________________

## 2022-07-15 ENCOUNTER — Other Ambulatory Visit: Payer: Self-pay

## 2022-07-15 ENCOUNTER — Encounter (HOSPITAL_COMMUNITY): Payer: Self-pay

## 2022-07-15 ENCOUNTER — Encounter (HOSPITAL_COMMUNITY)
Admission: RE | Admit: 2022-07-15 | Discharge: 2022-07-15 | Disposition: A | Discharge status: Home / Self Care | Payer: Medicare HMO | Source: Ambulatory Visit | Attending: Orthopedic Surgery | Admitting: Orthopedic Surgery

## 2022-07-15 VITALS — BP 123/77 | HR 53 | Temp 98.4°F | Resp 16 | Ht 75.0 in | Wt 298.0 lb

## 2022-07-15 DIAGNOSIS — Z01818 Encounter for other preprocedural examination: Secondary | ICD-10-CM

## 2022-07-15 HISTORY — DX: Unspecified osteoarthritis, unspecified site: M19.90

## 2022-07-15 LAB — CBC
HCT: 44.1 % (ref 39.0–52.0)
Hemoglobin: 14.6 g/dL (ref 13.0–17.0)
MCH: 31.8 pg (ref 26.0–34.0)
MCHC: 33.1 g/dL (ref 30.0–36.0)
MCV: 96.1 fL (ref 80.0–100.0)
Platelets: 175 10*3/uL (ref 150–400)
RBC: 4.59 MIL/uL (ref 4.22–5.81)
RDW: 12.7 % (ref 11.5–15.5)
WBC: 4.4 10*3/uL (ref 4.0–10.5)
nRBC: 0 % (ref 0.0–0.2)

## 2022-07-15 LAB — SURGICAL PCR SCREEN
MRSA, PCR: NEGATIVE
Staphylococcus aureus: NEGATIVE

## 2022-07-15 NOTE — Progress Notes (Addendum)
PCP - Foye Deer , MD Cardiologist - no  PPM/ICD -  Device Orders -  Rep Notified -   Chest x-ray -  EKG -  Stress Test -  ECHO -  Cardiac Cath -   Sleep Study -  CPAP -   Fasting Blood Sugar -  Checks Blood Sugar _____ times a day  Blood Thinner Instructions: Aspirin Instructions:  ERAS Protcol - PRE-SURGERY Ensure    COVID vaccine -x3  Activity--Able to climb a flight of stairs without SOB or CP Anesthesia review:   Patient denies shortness of breath, fever, cough and chest pain at PAT appointment   All instructions explained to the patient, with a verbal understanding of the material. Patient agrees to go over the instructions while at home for a better understanding. Patient also instructed to self quarantine after being tested for COVID-19. The opportunity to ask questions was provided.

## 2022-07-22 ENCOUNTER — Ambulatory Visit: Payer: Self-pay | Admitting: Student

## 2022-07-22 NOTE — H&P (View-Only) (Signed)
TOTAL KNEE ADMISSION H&P  Patient is being admitted for right total knee arthroplasty.  Subjective:  Chief Complaint:right knee pain.  HPI: Robert Knapp, 67 y.o. male, has a history of pain and functional disability in the right knee due to arthritis and has failed non-surgical conservative treatments for greater than 12 weeks to includeNSAID's and/or analgesics, corticosteriod injections, flexibility and strengthening excercises, use of assistive devices, and activity modification.  Onset of symptoms was gradual, starting 10 years ago with rapidlly worsening course since that time. The patient noted no past surgery on the right knee(s).  Patient currently rates pain in the right knee(s) at 10 out of 10 with activity. Patient has night pain, worsening of pain with activity and weight bearing, pain that interferes with activities of daily living, pain with passive range of motion, crepitus, and joint swelling.  Patient has evidence of subchondral cysts, subchondral sclerosis, periarticular osteophytes, and joint space narrowing by imaging studies. There is no active infection.  There are no problems to display for this patient.  Past Medical History:  Diagnosis Date   Allergy    Arthritis     Past Surgical History:  Procedure Laterality Date   COLONOSCOPY     NO PAST SURGERIES     POLYPECTOMY      Current Outpatient Medications  Medication Sig Dispense Refill Last Dose   calcium carbonate (TUMS - DOSED IN MG ELEMENTAL CALCIUM) 500 MG chewable tablet Chew 1-2 tablets by mouth daily as needed for indigestion or heartburn.      celecoxib (CELEBREX) 200 MG capsule Take 1 capsule by mouth 2 times daily. 180 capsule 3    cetirizine (ZYRTEC) 10 MG tablet Take 10 mg by mouth daily as needed for allergies.      Menthol-Methyl Salicylate (TYLENOL PRECISE PAIN RELIEVING EX) Apply 1 Application topically daily as needed (pain).      naproxen sodium (ALEVE) 220 MG tablet Take 440 mg by mouth daily as  needed (pain).      Current Facility-Administered Medications  Medication Dose Route Frequency Provider Last Rate Last Admin   0.9 %  sodium chloride infusion  500 mL Intravenous Continuous Lynann Bologna, MD       No Known Allergies  Social History   Tobacco Use   Smoking status: Never   Smokeless tobacco: Never  Substance Use Topics   Alcohol use: Not Currently    Family History  Problem Relation Age of Onset   Heart disease Mother    Colon cancer Mother 79   Colon polyps Neg Hx    Esophageal cancer Neg Hx    Rectal cancer Neg Hx    Stomach cancer Neg Hx      Review of Systems  Musculoskeletal:  Positive for arthralgias, gait problem and joint swelling.  All other systems reviewed and are negative.   Objective:  Physical Exam Constitutional:      Appearance: Normal appearance.  HENT:     Head: Normocephalic and atraumatic.     Nose: Nose normal.     Mouth/Throat:     Mouth: Mucous membranes are moist.     Pharynx: Oropharynx is clear.  Eyes:     Conjunctiva/sclera: Conjunctivae normal.  Cardiovascular:     Rate and Rhythm: Normal rate and regular rhythm.     Pulses: Normal pulses.     Heart sounds: Normal heart sounds.  Pulmonary:     Effort: Pulmonary effort is normal.     Breath sounds: Normal breath sounds.  Abdominal:     General: Abdomen is flat.     Palpations: Abdomen is soft.  Genitourinary:    Comments: deferred Musculoskeletal:     Cervical back: Normal range of motion and neck supple.     Comments: Examination of the right knee reveals no skin wounds or lesions. Valgus deformity. He has swelling, trace effusion. No warmth or erythema. Tenderness to palpation medial joint line, lateral joint line, peripatellar retinacular tissues with a positive grind sign. Range of motion is 10 to 100 degrees without any ligamentous instability. Painless range of motion of the hip.  Distally, there is no focal motor or sensory deficit. He has palpable pedal  pulses.  He ambulates with an antalgic gait.  Skin:    General: Skin is warm and dry.     Capillary Refill: Capillary refill takes less than 2 seconds.  Neurological:     General: No focal deficit present.     Mental Status: He is alert and oriented to person, place, and time.  Psychiatric:        Mood and Affect: Mood normal.        Behavior: Behavior normal.        Thought Content: Thought content normal.        Judgment: Judgment normal.     Vital signs in last 24 hours: @VSRANGES @  Labs:   Estimated body mass index is 37.25 kg/m as calculated from the following:   Height as of 07/15/22: 6\' 3"  (1.905 m).   Weight as of 07/15/22: 135.2 kg.   Imaging Review Plain radiographs demonstrate severe degenerative joint disease of the right knee(s). The overall alignment issignificant valgus. The bone quality appears to be adequate for age and reported activity level.      Assessment/Plan:  End stage arthritis, right knee   The patient history, physical examination, clinical judgment of the provider and imaging studies are consistent with end stage degenerative joint disease of the right knee(s) and total knee arthroplasty is deemed medically necessary. The treatment options including medical management, injection therapy arthroscopy and arthroplasty were discussed at length. The risks and benefits of total knee arthroplasty were presented and reviewed. The risks due to aseptic loosening, infection, stiffness, patella tracking problems, thromboembolic complications and other imponderables were discussed. The patient acknowledged the explanation, agreed to proceed with the plan and consent was signed. Patient is being admitted for inpatient treatment for surgery, pain control, PT, OT, prophylactic antibiotics, VTE prophylaxis, progressive ambulation and ADL's and discharge planning. The patient is planning to be discharged home with OPPT.  Therapy Plans: outpatient therapy. PT at [facility] 07/28/22 1st appointment.  Disposition: Home with wife Planned DVT Prophylaxis: aspirin 81mg  BID DME needed: walker. Has ice machine.  PCP: Cleared TXA: IV Allergies: NDKA.  Anesthesia Concerns: None.  BMI: 39.5 Last HgbA1c: 5.8 Other: - Oxycodone, zofran, methocarbamol, has celebrex.  [facility].  - 07/15/22: Hgb 16.6.      Patient's anticipated LOS is less than 2 midnights, meeting these requirements: - Younger than 50 - Lives within 1 hour of care - Has a competent adult at home to recover with post-op recover - NO history of  - Chronic pain requiring opiods  - Diabetes  - Coronary Artery Disease  - Heart failure  - Heart attack  - Stroke  - DVT/VTE  - Cardiac arrhythmia  - Respiratory Failure/COPD  - Renal failure  - Anemia  - Advanced Liver disease

## 2022-07-22 NOTE — H&P (Signed)
TOTAL KNEE ADMISSION H&P  Patient is being admitted for right total knee arthroplasty.  Subjective:  Chief Complaint:right knee pain.  HPI: Roen W Mancia, 67 y.o. male, has a history of pain and functional disability in the right knee due to arthritis and has failed non-surgical conservative treatments for greater than 12 weeks to includeNSAID's and/or analgesics, corticosteriod injections, flexibility and strengthening excercises, use of assistive devices, and activity modification.  Onset of symptoms was gradual, starting 10 years ago with rapidlly worsening course since that time. The patient noted no past surgery on the right knee(s).  Patient currently rates pain in the right knee(s) at 10 out of 10 with activity. Patient has night pain, worsening of pain with activity and weight bearing, pain that interferes with activities of daily living, pain with passive range of motion, crepitus, and joint swelling.  Patient has evidence of subchondral cysts, subchondral sclerosis, periarticular osteophytes, and joint space narrowing by imaging studies. There is no active infection.  There are no problems to display for this patient.  Past Medical History:  Diagnosis Date   Allergy    Arthritis     Past Surgical History:  Procedure Laterality Date   COLONOSCOPY     NO PAST SURGERIES     POLYPECTOMY      Current Outpatient Medications  Medication Sig Dispense Refill Last Dose   calcium carbonate (TUMS - DOSED IN MG ELEMENTAL CALCIUM) 500 MG chewable tablet Chew 1-2 tablets by mouth daily as needed for indigestion or heartburn.      celecoxib (CELEBREX) 200 MG capsule Take 1 capsule by mouth 2 times daily. 180 capsule 3    cetirizine (ZYRTEC) 10 MG tablet Take 10 mg by mouth daily as needed for allergies.      Menthol-Methyl Salicylate (TYLENOL PRECISE PAIN RELIEVING EX) Apply 1 Application topically daily as needed (pain).      naproxen sodium (ALEVE) 220 MG tablet Take 440 mg by mouth daily as  needed (pain).      Current Facility-Administered Medications  Medication Dose Route Frequency Provider Last Rate Last Admin   0.9 %  sodium chloride infusion  500 mL Intravenous Continuous Gupta, Rajesh, MD       No Known Allergies  Social History   Tobacco Use   Smoking status: Never   Smokeless tobacco: Never  Substance Use Topics   Alcohol use: Not Currently    Family History  Problem Relation Age of Onset   Heart disease Mother    Colon cancer Mother 57   Colon polyps Neg Hx    Esophageal cancer Neg Hx    Rectal cancer Neg Hx    Stomach cancer Neg Hx      Review of Systems  Musculoskeletal:  Positive for arthralgias, gait problem and joint swelling.  All other systems reviewed and are negative.   Objective:  Physical Exam Constitutional:      Appearance: Normal appearance.  HENT:     Head: Normocephalic and atraumatic.     Nose: Nose normal.     Mouth/Throat:     Mouth: Mucous membranes are moist.     Pharynx: Oropharynx is clear.  Eyes:     Conjunctiva/sclera: Conjunctivae normal.  Cardiovascular:     Rate and Rhythm: Normal rate and regular rhythm.     Pulses: Normal pulses.     Heart sounds: Normal heart sounds.  Pulmonary:     Effort: Pulmonary effort is normal.     Breath sounds: Normal breath sounds.    Abdominal:     General: Abdomen is flat.     Palpations: Abdomen is soft.  Genitourinary:    Comments: deferred Musculoskeletal:     Cervical back: Normal range of motion and neck supple.     Comments: Examination of the right knee reveals no skin wounds or lesions. Valgus deformity. He has swelling, trace effusion. No warmth or erythema. Tenderness to palpation medial joint line, lateral joint line, peripatellar retinacular tissues with a positive grind sign. Range of motion is 10 to 100 degrees without any ligamentous instability. Painless range of motion of the hip.  Distally, there is no focal motor or sensory deficit. He has palpable pedal  pulses.  He ambulates with an antalgic gait.  Skin:    General: Skin is warm and dry.     Capillary Refill: Capillary refill takes less than 2 seconds.  Neurological:     General: No focal deficit present.     Mental Status: He is alert and oriented to person, place, and time.  Psychiatric:        Mood and Affect: Mood normal.        Behavior: Behavior normal.        Thought Content: Thought content normal.        Judgment: Judgment normal.     Vital signs in last 24 hours: @VSRANGES@  Labs:   Estimated body mass index is 37.25 kg/m as calculated from the following:   Height as of 07/15/22: 6' 3" (1.905 m).   Weight as of 07/15/22: 135.2 kg.   Imaging Review Plain radiographs demonstrate severe degenerative joint disease of the right knee(s). The overall alignment issignificant valgus. The bone quality appears to be adequate for age and reported activity level.      Assessment/Plan:  End stage arthritis, right knee   The patient history, physical examination, clinical judgment of the provider and imaging studies are consistent with end stage degenerative joint disease of the right knee(s) and total knee arthroplasty is deemed medically necessary. The treatment options including medical management, injection therapy arthroscopy and arthroplasty were discussed at length. The risks and benefits of total knee arthroplasty were presented and reviewed. The risks due to aseptic loosening, infection, stiffness, patella tracking problems, thromboembolic complications and other imponderables were discussed. The patient acknowledged the explanation, agreed to proceed with the plan and consent was signed. Patient is being admitted for inpatient treatment for surgery, pain control, PT, OT, prophylactic antibiotics, VTE prophylaxis, progressive ambulation and ADL's and discharge planning. The patient is planning to be discharged home with OPPT.  Therapy Plans: outpatient therapy. PT at [facility] 07/28/22 1st appointment.  Disposition: Home with wife Planned DVT Prophylaxis: aspirin 81mg BID DME needed: walker. Has ice machine.  PCP: Cleared TXA: IV Allergies: NDKA.  Anesthesia Concerns: None.  BMI: 39.5 Last HgbA1c: 5.8 Other: - Oxycodone, zofran, methocarbamol, has celebrex.  - [facility].  - 07/15/22: Hgb 16.6.      Patient's anticipated LOS is less than 2 midnights, meeting these requirements: - Younger than 65 - Lives within 1 hour of care - Has a competent adult at home to recover with post-op recover - NO history of  - Chronic pain requiring opiods  - Diabetes  - Coronary Artery Disease  - Heart failure  - Heart attack  - Stroke  - DVT/VTE  - Cardiac arrhythmia  - Respiratory Failure/COPD  - Renal failure  - Anemia  - Advanced Liver disease   

## 2022-07-24 ENCOUNTER — Other Ambulatory Visit (HOSPITAL_COMMUNITY): Payer: Self-pay

## 2022-07-24 ENCOUNTER — Ambulatory Visit (HOSPITAL_COMMUNITY): Payer: Medicare HMO | Admitting: Anesthesiology

## 2022-07-24 ENCOUNTER — Ambulatory Visit (HOSPITAL_COMMUNITY): Payer: Medicare HMO

## 2022-07-24 ENCOUNTER — Other Ambulatory Visit: Payer: Self-pay

## 2022-07-24 ENCOUNTER — Ambulatory Visit (HOSPITAL_COMMUNITY)
Admission: RE | Admit: 2022-07-24 | Discharge: 2022-07-24 | Disposition: A | Discharge status: Home / Self Care | Payer: Medicare HMO | Source: Ambulatory Visit | Attending: Orthopedic Surgery | Admitting: Orthopedic Surgery

## 2022-07-24 ENCOUNTER — Encounter (HOSPITAL_COMMUNITY): Payer: Self-pay | Admitting: Orthopedic Surgery

## 2022-07-24 ENCOUNTER — Ambulatory Visit (HOSPITAL_BASED_OUTPATIENT_CLINIC_OR_DEPARTMENT_OTHER): Payer: Medicare HMO | Admitting: Anesthesiology

## 2022-07-24 ENCOUNTER — Encounter (HOSPITAL_COMMUNITY)
Admission: RE | Disposition: A | Discharge status: Home / Self Care | Payer: Self-pay | Source: Ambulatory Visit | Attending: Orthopedic Surgery

## 2022-07-24 DIAGNOSIS — Z471 Aftercare following joint replacement surgery: Secondary | ICD-10-CM | POA: Diagnosis not present

## 2022-07-24 DIAGNOSIS — Z96651 Presence of right artificial knee joint: Secondary | ICD-10-CM | POA: Diagnosis not present

## 2022-07-24 DIAGNOSIS — G8918 Other acute postprocedural pain: Secondary | ICD-10-CM | POA: Diagnosis not present

## 2022-07-24 DIAGNOSIS — M1711 Unilateral primary osteoarthritis, right knee: Secondary | ICD-10-CM

## 2022-07-24 HISTORY — PX: KNEE ARTHROPLASTY: SHX992

## 2022-07-24 SURGERY — ARTHROPLASTY, KNEE, TOTAL, USING IMAGELESS COMPUTER-ASSISTED NAVIGATION
Anesthesia: Spinal | Site: Knee | Laterality: Right

## 2022-07-24 MED ORDER — POVIDONE-IODINE 10 % EX SWAB: 2.0000 | Freq: Once | CUTANEOUS | Status: DC

## 2022-07-24 MED ORDER — ACETAMINOPHEN 500 MG PO TABS: 1000.0000 mg | ORAL_TABLET | Freq: Once | ORAL | Status: DC

## 2022-07-24 MED ORDER — DEXAMETHASONE SODIUM PHOSPHATE 10 MG/ML IJ SOLN
INTRAMUSCULAR | Status: AC
  Filled 2022-07-24: qty 1

## 2022-07-24 MED ORDER — PHENYLEPHRINE 80 MCG/ML (10ML) SYRINGE FOR IV PUSH (FOR BLOOD PRESSURE SUPPORT)
PREFILLED_SYRINGE | INTRAVENOUS | Status: AC
  Filled 2022-07-24: qty 10

## 2022-07-24 MED ORDER — CEFAZOLIN SODIUM-DEXTROSE 2-4 GM/100ML-% IV SOLN: 2.0000 g | Freq: Four times a day (QID) | INTRAVENOUS | Status: DC

## 2022-07-24 MED ORDER — KETOROLAC TROMETHAMINE 30 MG/ML IJ SOLN
INTRAMUSCULAR | Status: DC | PRN
  Administered 2022-07-24: 30 mg

## 2022-07-24 MED ORDER — ACETAMINOPHEN 500 MG PO TABS
1000.0000 mg | ORAL_TABLET | Freq: Three times a day (TID) | ORAL | 0 refills | Status: AC
  Filled 2022-07-24: qty 180, 30d supply, fill #0

## 2022-07-24 MED ORDER — LACTATED RINGERS IV SOLN: INTRAVENOUS | Status: DC

## 2022-07-24 MED ORDER — CHLORHEXIDINE GLUCONATE 0.12 % MT SOLN
15.0000 mL | Freq: Once | OROMUCOSAL | Status: AC
  Administered 2022-07-24: 15 mL via OROMUCOSAL

## 2022-07-24 MED ORDER — PROPOFOL 10 MG/ML IV BOLUS
INTRAVENOUS | Status: DC | PRN
  Administered 2022-07-24: 10 mg via INTRAVENOUS

## 2022-07-24 MED ORDER — CELECOXIB 200 MG PO CAPS: 200.0000 mg | ORAL_CAPSULE | Freq: Two times a day (BID) | ORAL | Status: DC

## 2022-07-24 MED ORDER — ACETAMINOPHEN 325 MG PO TABS: 325.0000 mg | ORAL_TABLET | Freq: Four times a day (QID) | ORAL | Status: DC | PRN

## 2022-07-24 MED ORDER — MIDAZOLAM HCL 2 MG/2ML IJ SOLN
INTRAMUSCULAR | Status: AC
  Filled 2022-07-24: qty 2

## 2022-07-24 MED ORDER — DEXAMETHASONE SODIUM PHOSPHATE 10 MG/ML IJ SOLN
INTRAMUSCULAR | Status: DC | PRN
  Administered 2022-07-24: 10 mg

## 2022-07-24 MED ORDER — DEXAMETHASONE SODIUM PHOSPHATE 10 MG/ML IJ SOLN
INTRAMUSCULAR | Status: DC | PRN
  Administered 2022-07-24: 10 mg via INTRAVENOUS

## 2022-07-24 MED ORDER — METOCLOPRAMIDE HCL 5 MG PO TABS: 5.0000 mg | ORAL_TABLET | Freq: Three times a day (TID) | ORAL | Status: DC | PRN

## 2022-07-24 MED ORDER — ONDANSETRON HCL 4 MG/2ML IJ SOLN: 4.0000 mg | Freq: Four times a day (QID) | INTRAMUSCULAR | Status: DC | PRN

## 2022-07-24 MED ORDER — BUPIVACAINE HCL (PF) 0.25 % IJ SOLN
INTRAMUSCULAR | Status: DC | PRN
  Administered 2022-07-24: 30 mL

## 2022-07-24 MED ORDER — OXYCODONE HCL 5 MG PO TABS: 5.0000 mg | ORAL_TABLET | ORAL | Status: DC | PRN

## 2022-07-24 MED ORDER — HYDROMORPHONE HCL 1 MG/ML IJ SOLN: 0.5000 mg | INTRAMUSCULAR | Status: DC | PRN

## 2022-07-24 MED ORDER — ROPIVACAINE HCL 5 MG/ML IJ SOLN
INTRAMUSCULAR | Status: DC | PRN
  Administered 2022-07-24: 20 mL via PERINEURAL

## 2022-07-24 MED ORDER — ONDANSETRON HCL 4 MG/2ML IJ SOLN
INTRAMUSCULAR | Status: AC
  Filled 2022-07-24: qty 2

## 2022-07-24 MED ORDER — LACTATED RINGERS IV BOLUS: 500.0000 mL | Freq: Once | INTRAVENOUS | Status: DC

## 2022-07-24 MED ORDER — CEFAZOLIN IN SODIUM CHLORIDE 3-0.9 GM/100ML-% IV SOLN
3.0000 g | INTRAVENOUS | Status: AC
  Administered 2022-07-24: 3 g via INTRAVENOUS
  Filled 2022-07-24: qty 100

## 2022-07-24 MED ORDER — ORAL CARE MOUTH RINSE: 15.0000 mL | Freq: Once | OROMUCOSAL | Status: AC

## 2022-07-24 MED ORDER — ACETAMINOPHEN 500 MG PO TABS
1000.0000 mg | ORAL_TABLET | Freq: Once | ORAL | Status: AC
  Administered 2022-07-24: 1000 mg via ORAL
  Filled 2022-07-24: qty 2

## 2022-07-24 MED ORDER — ONDANSETRON HCL 4 MG PO TABS: 4.0000 mg | ORAL_TABLET | Freq: Four times a day (QID) | ORAL | Status: DC | PRN

## 2022-07-24 MED ORDER — PROPOFOL 500 MG/50ML IV EMUL
INTRAVENOUS | Status: DC | PRN
  Administered 2022-07-24: 100 ug/kg/min via INTRAVENOUS

## 2022-07-24 MED ORDER — SODIUM CHLORIDE 0.9 % IR SOLN
Status: DC | PRN
  Administered 2022-07-24 (×2): 1000 mL

## 2022-07-24 MED ORDER — PHENYLEPHRINE HCL (PRESSORS) 10 MG/ML IV SOLN
INTRAVENOUS | Status: AC
  Filled 2022-07-24: qty 1

## 2022-07-24 MED ORDER — OXYCODONE HCL 5 MG PO TABS
5.0000 mg | ORAL_TABLET | ORAL | 0 refills | Status: AC | PRN
  Filled 2022-07-24: qty 42, 7d supply, fill #0

## 2022-07-24 MED ORDER — SENNA 8.6 MG PO TABS
2.0000 | ORAL_TABLET | Freq: Every day | ORAL | 1 refills | Status: AC
  Filled 2022-07-24: qty 60, 30d supply, fill #0

## 2022-07-24 MED ORDER — FENTANYL CITRATE (PF) 100 MCG/2ML IJ SOLN
INTRAMUSCULAR | Status: AC
  Filled 2022-07-24: qty 2

## 2022-07-24 MED ORDER — TRANEXAMIC ACID-NACL 1000-0.7 MG/100ML-% IV SOLN
1000.0000 mg | INTRAVENOUS | Status: AC
  Administered 2022-07-24: 1000 mg via INTRAVENOUS
  Filled 2022-07-24: qty 100

## 2022-07-24 MED ORDER — MIDAZOLAM HCL 5 MG/5ML IJ SOLN
INTRAMUSCULAR | Status: DC | PRN
  Administered 2022-07-24: 2 mg via INTRAVENOUS

## 2022-07-24 MED ORDER — ONDANSETRON HCL 4 MG/2ML IJ SOLN: 4.0000 mg | Freq: Once | INTRAMUSCULAR | Status: DC | PRN

## 2022-07-24 MED ORDER — 0.9 % SODIUM CHLORIDE (POUR BTL) OPTIME
TOPICAL | Status: DC | PRN
  Administered 2022-07-24: 1000 mL

## 2022-07-24 MED ORDER — CEFAZOLIN SODIUM-DEXTROSE 2-4 GM/100ML-% IV SOLN
INTRAVENOUS | Status: AC
  Administered 2022-07-24: 2 g via INTRAVENOUS
  Filled 2022-07-24: qty 100

## 2022-07-24 MED ORDER — ASPIRIN 81 MG PO CHEW
81.0000 mg | CHEWABLE_TABLET | Freq: Two times a day (BID) | ORAL | 0 refills | Status: AC
  Filled 2022-07-24: qty 90, 45d supply, fill #0

## 2022-07-24 MED ORDER — KETOROLAC TROMETHAMINE 30 MG/ML IJ SOLN
INTRAMUSCULAR | Status: AC
  Filled 2022-07-24: qty 1

## 2022-07-24 MED ORDER — STERILE WATER FOR IRRIGATION IR SOLN
Status: DC | PRN
  Administered 2022-07-24 (×2): 1000 mL

## 2022-07-24 MED ORDER — OXYCODONE HCL 5 MG PO TABS: 10.0000 mg | ORAL_TABLET | ORAL | Status: DC | PRN

## 2022-07-24 MED ORDER — SODIUM CHLORIDE (PF) 0.9 % IJ SOLN
INTRAMUSCULAR | Status: DC | PRN
  Administered 2022-07-24: 10 mL

## 2022-07-24 MED ORDER — ONDANSETRON HCL 4 MG/2ML IJ SOLN
INTRAMUSCULAR | Status: DC | PRN
  Administered 2022-07-24: 4 mg via INTRAVENOUS

## 2022-07-24 MED ORDER — FENTANYL CITRATE (PF) 100 MCG/2ML IJ SOLN
INTRAMUSCULAR | Status: DC | PRN
  Administered 2022-07-24: 50 ug via INTRAVENOUS

## 2022-07-24 MED ORDER — LACTATED RINGERS IV BOLUS: 250.0000 mL | Freq: Once | INTRAVENOUS | Status: DC

## 2022-07-24 MED ORDER — SODIUM CHLORIDE (PF) 0.9 % IJ SOLN
INTRAMUSCULAR | Status: AC
  Filled 2022-07-24: qty 30

## 2022-07-24 MED ORDER — DOCUSATE SODIUM 100 MG PO CAPS
100.0000 mg | ORAL_CAPSULE | Freq: Two times a day (BID) | ORAL | 1 refills | Status: AC
  Filled 2022-07-24: qty 60, 30d supply, fill #0

## 2022-07-24 MED ORDER — FENTANYL CITRATE PF 50 MCG/ML IJ SOSY: 25.0000 ug | PREFILLED_SYRINGE | INTRAMUSCULAR | Status: DC | PRN

## 2022-07-24 MED ORDER — BUPIVACAINE IN DEXTROSE 0.75-8.25 % IT SOLN
INTRATHECAL | Status: DC | PRN
  Administered 2022-07-24: 2 mL via INTRATHECAL

## 2022-07-24 MED ORDER — METHOCARBAMOL 500 MG IVPB - SIMPLE MED: 500.0000 mg | Freq: Four times a day (QID) | INTRAVENOUS | Status: DC | PRN

## 2022-07-24 MED ORDER — OXYCODONE HCL 5 MG PO TABS
ORAL_TABLET | ORAL | Status: AC
  Administered 2022-07-24: 5 mg via ORAL
  Filled 2022-07-24: qty 1

## 2022-07-24 MED ORDER — PHENYLEPHRINE HCL-NACL 20-0.9 MG/250ML-% IV SOLN
INTRAVENOUS | Status: DC | PRN
  Administered 2022-07-24: 25 ug/min via INTRAVENOUS

## 2022-07-24 MED ORDER — ONDANSETRON HCL 4 MG PO TABS
4.0000 mg | ORAL_TABLET | Freq: Three times a day (TID) | ORAL | 0 refills | Status: DC | PRN
  Filled 2022-07-24: qty 20, 7d supply, fill #0

## 2022-07-24 MED ORDER — BUPIVACAINE HCL 0.25 % IJ SOLN
INTRAMUSCULAR | Status: AC
  Filled 2022-07-24: qty 1

## 2022-07-24 MED ORDER — METHOCARBAMOL 500 MG PO TABS: 500.0000 mg | ORAL_TABLET | Freq: Four times a day (QID) | ORAL | Status: DC | PRN

## 2022-07-24 MED ORDER — POLYETHYLENE GLYCOL 3350 17 G PO PACK
17.0000 g | PACK | Freq: Every day | ORAL | 0 refills | Status: DC | PRN
  Filled 2022-07-24: qty 14, 14d supply, fill #0

## 2022-07-24 MED ORDER — METOCLOPRAMIDE HCL 5 MG/ML IJ SOLN: 5.0000 mg | Freq: Three times a day (TID) | INTRAMUSCULAR | Status: DC | PRN

## 2022-07-24 SURGICAL SUPPLY — 75 items
ADH SKN CLS APL DERMABOND .7 (GAUZE/BANDAGES/DRESSINGS) ×1
APL PRP STRL LF DISP 70% ISPRP (MISCELLANEOUS) ×2
BAG COUNTER SPONGE SURGICOUNT (BAG) IMPLANT
BAG SPEC THK2 15X12 ZIP CLS (MISCELLANEOUS)
BAG SPNG CNTER NS LX DISP (BAG)
BAG ZIPLOCK 12X15 (MISCELLANEOUS) IMPLANT
BATTERY INSTRU NAVIGATION (MISCELLANEOUS) ×3 IMPLANT
BLADE SAW RECIPROCATING 77.5 (BLADE) ×1 IMPLANT
BNDG CMPR 5X4 KNIT ELC UNQ LF (GAUZE/BANDAGES/DRESSINGS) ×1
BNDG CMPR 5X62 HK CLSR LF (GAUZE/BANDAGES/DRESSINGS) ×1
BNDG CMPR MED 10X6 ELC LF (GAUZE/BANDAGES/DRESSINGS) ×1
BNDG ELASTIC 4INX 5YD STR LF (GAUZE/BANDAGES/DRESSINGS) ×1 IMPLANT
BNDG ELASTIC 6INX 5YD STR LF (GAUZE/BANDAGES/DRESSINGS) ×1 IMPLANT
BNDG ELASTIC 6X10 VLCR STRL LF (GAUZE/BANDAGES/DRESSINGS) IMPLANT
BTRY SRG DRVR LF (MISCELLANEOUS) ×3
CHLORAPREP W/TINT 26 (MISCELLANEOUS) ×2 IMPLANT
COMP FEM CMT PS STD 11 RT (Joint) ×1 IMPLANT
COMP PATELLA POR NG 41X10 (Knees) ×1 IMPLANT
COMP TIB PS KNEE 0D H RT (Joint) ×1 IMPLANT
COMPONENT FEM CMT PS STD 11 RT (Joint) IMPLANT
COMPONENT PATELLA POR NG 41X10 (Knees) IMPLANT
COMPONET TIB PS KNEE 0D H RT (Joint) IMPLANT
COVER SURGICAL LIGHT HANDLE (MISCELLANEOUS) ×1 IMPLANT
DERMABOND ADVANCED .7 DNX12 (GAUZE/BANDAGES/DRESSINGS) ×2 IMPLANT
DRAPE SHEET LG 3/4 BI-LAMINATE (DRAPES) ×3 IMPLANT
DRAPE U-SHAPE 47X51 STRL (DRAPES) ×1 IMPLANT
DRSG AQUACEL AG ADV 3.5X10 (GAUZE/BANDAGES/DRESSINGS) ×1 IMPLANT
ELECT BLADE TIP CTD 4 INCH (ELECTRODE) ×1 IMPLANT
ELECT REM PT RETURN 15FT ADLT (MISCELLANEOUS) ×1 IMPLANT
GAUZE SPONGE 4X4 12PLY STRL (GAUZE/BANDAGES/DRESSINGS) ×1 IMPLANT
GLOVE BIO SURGEON STRL SZ7 (GLOVE) ×1 IMPLANT
GLOVE BIO SURGEON STRL SZ8.5 (GLOVE) ×2 IMPLANT
GLOVE BIOGEL PI IND STRL 7.5 (GLOVE) ×1 IMPLANT
GLOVE BIOGEL PI IND STRL 8.5 (GLOVE) ×1 IMPLANT
GOWN SPEC L3 XXLG W/TWL (GOWN DISPOSABLE) ×1 IMPLANT
GOWN STRL REUS W/ TWL XL LVL3 (GOWN DISPOSABLE) ×1 IMPLANT
GOWN STRL REUS W/TWL XL LVL3 (GOWN DISPOSABLE) ×1
HANDPIECE INTERPULSE COAX TIP (DISPOSABLE) ×1
HDLS TROCR DRIL PIN KNEE 75 (PIN) ×1
HOLDER FOLEY CATH W/STRAP (MISCELLANEOUS) ×1 IMPLANT
HOOD PEEL AWAY T7 (MISCELLANEOUS) ×3 IMPLANT
INSERT TIB ASF PS 7-12 10 GH (Insert) IMPLANT
KIT TURNOVER KIT A (KITS) IMPLANT
MARKER SKIN DUAL TIP RULER LAB (MISCELLANEOUS) ×1 IMPLANT
NDL SAFETY ECLIP 18X1.5 (MISCELLANEOUS) ×1 IMPLANT
NDL SPNL 18GX3.5 QUINCKE PK (NEEDLE) ×1 IMPLANT
NEEDLE SPNL 18GX3.5 QUINCKE PK (NEEDLE) ×1 IMPLANT
NS IRRIG 1000ML POUR BTL (IV SOLUTION) ×1 IMPLANT
PACK TOTAL KNEE CUSTOM (KITS) ×1 IMPLANT
PADDING CAST ABS COTTON 6X4 NS (CAST SUPPLIES) IMPLANT
PADDING CAST COTTON 6X4 STRL (CAST SUPPLIES) ×1 IMPLANT
PIN DRILL HDLS TROCAR 75 4PK (PIN) IMPLANT
PROTECTOR NERVE ULNAR (MISCELLANEOUS) ×1 IMPLANT
SAW OSC TIP CART 19.5X105X1.3 (SAW) ×1 IMPLANT
SCREW FEMALE HEX FIX 25X2.5 (ORTHOPEDIC DISPOSABLE SUPPLIES) IMPLANT
SEALER BIPOLAR AQUA 6.0 (INSTRUMENTS) ×1 IMPLANT
SET HNDPC FAN SPRY TIP SCT (DISPOSABLE) ×1 IMPLANT
SET PAD KNEE POSITIONER (MISCELLANEOUS) ×1 IMPLANT
SOLUTION PRONTOSAN WOUND 350ML (IRRIGATION / IRRIGATOR) IMPLANT
SPIKE FLUID TRANSFER (MISCELLANEOUS) ×2 IMPLANT
SUT MNCRL AB 3-0 PS2 18 (SUTURE) ×1 IMPLANT
SUT MNCRL AB 4-0 PS2 18 (SUTURE) IMPLANT
SUT MON AB 2-0 CT1 36 (SUTURE) ×1 IMPLANT
SUT STRATAFIX PDO 1 14 VIOLET (SUTURE) ×1
SUT STRATFX PDO 1 14 VIOLET (SUTURE) ×1
SUT VIC AB 1 CTX 36 (SUTURE) ×2
SUT VIC AB 1 CTX36XBRD ANBCTR (SUTURE) ×2 IMPLANT
SUT VIC AB 2-0 CT1 27 (SUTURE) ×1
SUT VIC AB 2-0 CT1 TAPERPNT 27 (SUTURE) ×1 IMPLANT
SUTURE STRATFX PDO 1 14 VIOLET (SUTURE) ×1 IMPLANT
SYR 3ML LL SCALE MARK (SYRINGE) ×1 IMPLANT
TRAY FOLEY MTR SLVR 16FR STAT (SET/KITS/TRAYS/PACK) IMPLANT
TUBE SUCTION HIGH CAP CLEAR NV (SUCTIONS) ×1 IMPLANT
WATER STERILE IRR 1000ML POUR (IV SOLUTION) ×2 IMPLANT
WRAP KNEE MAXI GEL POST OP (GAUZE/BANDAGES/DRESSINGS) IMPLANT

## 2022-07-24 NOTE — Evaluation (Signed)
Physical Therapy Evaluation Patient Details Name: Robert Knapp MRN: [id number] DOB: 25-May-1955 Today's Date: 07/24/2022  History of Present Illness  68 yo male S/P RTKA 07/24/22. PMH: noncontributory  Clinical Impression  The patient  ambulated x 100' using RW. Patient negotiated  3 steps, wife present for instruction, as well as with  HEP. Patient has  met PT goals for Dc home.       Recommendations for follow up therapy are one component of a multi-disciplinary discharge planning process, led by the attending physician.  Recommendations may be updated based on patient status, additional functional criteria and insurance authorization.  Follow Up Recommendations       Assistance Recommended at Discharge PRN  Patient can return home with the following  A little help with bathing/dressing/bathroom;Help with stairs or ramp for entrance;Assist for transportation    Equipment Recommendations Rolling walker (2 wheels)  Recommendations for Other Services       Functional Status Assessment Patient has had a recent decline in their functional status and demonstrates the ability to make significant improvements in function in a reasonable and predictable amount of time.     Precautions / Restrictions Precautions Precautions: Fall;Knee Restrictions Weight Bearing Restrictions: No      Mobility  Bed Mobility Overal bed mobility: Modified Independent                  Transfers Overall transfer level: Needs assistance Equipment used: Rolling walker (2 wheels) Transfers: Sit to/from Stand Sit to Stand: Supervision           General transfer comment: cues for hand and right leg position    Ambulation/Gait Ambulation/Gait assistance: Min guard Gait Distance (Feet): 80 Feet Assistive device: Rolling walker (2 wheels) Gait Pattern/deviations: Step-to pattern, Step-through pattern Gait velocity: decr     General Gait Details: cues for posture  Stairs Stairs:  Yes Stairs assistance: Min assist Stair Management: One rail Right, Forwards (simulated with walking stick on L but held rail) Number of Stairs: 3 General stair comments: cues for sequence  Wheelchair Mobility    Modified Rankin (Stroke Patients Only)       Balance Overall balance assessment: Mild deficits observed, not formally tested                                           Pertinent Vitals/Pain Pain Assessment Pain Assessment: Faces Faces Pain Scale: Hurts little more Pain Location: back of knee Pain Descriptors / Indicators: Discomfort, Tightness Pain Intervention(s): Limited activity within patient's tolerance, Monitored during session, Premedicated before session    Home Living Family/patient expects to be discharged to:: Private residence Living Arrangements: Spouse/significant other Available Help at Discharge: Family Type of Home: House Home Access: Stairs to enter Entrance Stairs-Rails: Doctor, general practice of Steps: 5   Home Layout: One level   Additional Comments: walking stick    Prior Function Prior Level of Function : Independent/Modified Independent;Driving;Working/employed                     Hand Dominance   Dominant Hand: Right    Extremity/Trunk Assessment   Upper Extremity Assessment Upper Extremity Assessment: Overall WFL for tasks assessed    Lower Extremity Assessment Lower Extremity Assessment: RLE deficits/detail RLE Deficits / Details: + SLR, Knee flexion to 70*( ace wrap)    Cervical / Trunk Assessment Cervical / Trunk Assessment:  Normal  Communication   Communication: No difficulties  Cognition Arousal/Alertness: Awake/alert Behavior During Therapy: WFL for tasks assessed/performed Overall Cognitive Status: Within Functional Limits for tasks assessed                                          General Comments      Exercises Total Joint Exercises Ankle  Circles/Pumps: AROM, Both, 10 reps Quad Sets: AROM, Both, 10 reps Straight Leg Raises: AROM, Right, 10 reps Long Arc Quad: AROM, Right, 5 reps Knee Flexion: AROM, Right, 5 reps   Assessment/Plan    PT Assessment All further PT needs can be met in the next venue of care  PT Problem List Decreased strength;Decreased mobility;Decreased range of motion;Decreased activity tolerance       PT Treatment Interventions      PT Goals (Current goals can be found in the Care Plan section)  Acute Rehab PT Goals Patient Stated Goal: go home PT Goal Formulation: All assessment and education complete, DC therapy    Frequency       Co-evaluation               AM-PAC PT "6 Clicks" Mobility  Outcome Measure Help needed turning from your back to your side while in a flat bed without using bedrails?: None Help needed moving from lying on your back to sitting on the side of a flat bed without using bedrails?: None Help needed moving to and from a bed to a chair (including a wheelchair)?: A Little Help needed standing up from a chair using your arms (e.g., wheelchair or bedside chair)?: A Little Help needed to walk in hospital room?: A Little Help needed climbing 3-5 steps with a railing? : A Little 6 Click Score: 20    End of Session Equipment Utilized During Treatment: Gait belt Activity Tolerance: Patient tolerated treatment well Patient left: in bed;with family/visitor present Nurse Communication: Mobility status PT Visit Diagnosis: Unsteadiness on feet (R26.81)    Time: 1610-9604 PT Time Calculation (min) (ACUTE ONLY): 32 min   Charges:   PT Evaluation $PT Eval Low Complexity: 1 Low PT Treatments $Gait Training: 8-22 mins        [name] PT Acute Rehabilitation Services Office [phone] Weekend [phone]   [name] 07/24/2022, 3:38 PM

## 2022-07-24 NOTE — Anesthesia Preprocedure Evaluation (Addendum)
Anesthesia Evaluation  Patient identified by MRN, date of birth, ID band Patient awake    Reviewed: Allergy & Precautions, NPO status , Patient's Chart, lab work & pertinent test results  Airway Mallampati: II  TM Distance: >3 FB Neck ROM: Full    Dental  (+) Dental Advisory Given, Upper Dentures   Pulmonary neg pulmonary ROS   Pulmonary exam normal breath sounds clear to auscultation       Cardiovascular negative cardio ROS Normal cardiovascular exam Rhythm:Regular Rate:Normal     Neuro/Psych negative neurological ROS     GI/Hepatic negative GI ROS, Neg liver ROS,,,  Endo/Other  Obesity   Renal/GU negative Renal ROS     Musculoskeletal  (+) Arthritis , Osteoarthritis,    Abdominal   Peds  Hematology negative hematology ROS (+) Plt 175k   Anesthesia Other Findings Day of surgery medications reviewed with the patient.  Reproductive/Obstetrics                             Anesthesia Physical Anesthesia Plan  ASA: 2  Anesthesia Plan: Spinal   Post-op Pain Management: Regional block* and Tylenol PO (pre-op)*   Induction: Intravenous  PONV Risk Score and Plan: 1 and TIVA, Midazolam, Dexamethasone and Ondansetron  Airway Management Planned: Natural Airway and Simple Face Mask  Additional Equipment:   Intra-op Plan:   Post-operative Plan:   Informed Consent: I have reviewed the patients History and Physical, chart, labs and discussed the procedure including the risks, benefits and alternatives for the proposed anesthesia with the patient or authorized representative who has indicated his/her understanding and acceptance.     Dental advisory given  Plan Discussed with: CRNA  Anesthesia Plan Comments:         Anesthesia Quick Evaluation

## 2022-07-24 NOTE — Discharge Instructions (Signed)
 Dr. Frank Novelo Total Joint Specialist [facility] [address] [place], [place] [address] [phone]  TOTAL KNEE REPLACEMENT POSTOPERATIVE DIRECTIONS    Knee Rehabilitation, Guidelines Following Surgery  Results after knee surgery are often greatly improved when you follow the exercise, range of motion and muscle strengthening exercises prescribed by your doctor. Safety measures are also important to protect the knee from further injury. Any time any of these exercises cause you to have increased pain or swelling in your knee joint, decrease the amount until you are comfortable again and slowly increase them. If you have problems or questions, call your caregiver or physical therapist for advice.   WEIGHT BEARING Weight bearing as tolerated with assist device (walker, cane, etc) as directed, use it as long as suggested by your surgeon or therapist, typically at least 4-6 weeks.  HOME CARE INSTRUCTIONS  Remove items at home which could result in a fall. This includes throw rugs or furniture in walking pathways.  Continue medications as instructed at time of discharge. You may have some home medications which will be placed on hold until you complete the course of blood thinner medication.  You may start showering once you are discharged home but do not submerge the incision under water. Just pat the incision dry and apply a dry gauze dressing on daily. Walk with walker as instructed.  You may resume a sexual relationship in one month or when given the OK by your doctor.  Use walker as long as suggested by your caregivers. Avoid periods of inactivity such as sitting longer than an hour when not asleep. This helps prevent blood clots.  You may put full weight on your legs and walk as much as is comfortable.  You may return to work once you are cleared by your doctor.  Do not drive a car for 6 weeks or until released by you surgeon.  Do not drive while  taking narcotics.  Wear the elastic stockings for three weeks following surgery during the day but you may remove then at night. Make sure you keep all of your appointments after your operation with all of your doctors and caregivers. You should call the office at the above phone number and make an appointment for approximately two weeks after the date of your surgery. Do not remove your surgical dressing. The dressing is waterproof; you may take showers in 3 days, but do not take tub baths or submerge the dressing. Please pick up a stool softener and laxative for home use as long as you are requiring pain medications. ICE to the affected knee every three hours for 30 minutes at a time and then as needed for pain and swelling.  Continue to use ice on the knee for pain and swelling from surgery. You may notice swelling that will progress down to the foot and ankle.  This is normal after surgery.  Elevate the leg when you are not up walking on it.   It is important for you to complete the blood thinner medication as prescribed by your doctor. Continue to use the breathing machine which will help keep your temperature down.  It is common for your temperature to cycle up and down following surgery, especially at night when you are not up moving around and exerting yourself.  The breathing machine keeps your lungs expanded and your temperature down.  RANGE OF MOTION AND STRENGTHENING EXERCISES  Rehabilitation of the knee is important following a knee injury or an   operation. After just a few days of immobilization, the muscles of the thigh which control the knee become weakened and shrink (atrophy). Knee exercises are designed to build up the tone and strength of the thigh muscles and to improve knee motion. Often times heat used for twenty to thirty minutes before working out will loosen up your tissues and help with improving the range of motion but do not use heat for the first two weeks following surgery.  These exercises can be done on a training (exercise) mat, on the floor, on a table or on a bed. Use what ever works the best and is most comfortable for you Knee exercises include:  Leg Lifts - While your knee is still immobilized in a splint or cast, you can do straight leg raises. Lift the leg to 60 degrees, hold for 3 sec, and slowly lower the leg. Repeat 10-20 times 2-3 times daily. Perform this exercise against resistance later as your knee gets better.  Quad and Hamstring Sets - Tighten up the muscle on the front of the thigh (Quad) and hold for 5-10 sec. Repeat this 10-20 times hourly. Hamstring sets are done by pushing the foot backward against an object and holding for 5-10 sec. Repeat as with quad sets.  A rehabilitation program following serious knee injuries can speed recovery and prevent re-injury in the future due to weakened muscles. Contact your doctor or a physical therapist for more information on knee rehabilitation.   POST-OPERATIVE OPIOID TAPER INSTRUCTIONS: It is important to wean off of your opioid medication as soon as possible. If you do not need pain medication after your surgery it is ok to stop day one. Opioids include: Codeine, Hydrocodone(Norco, Vicodin), Oxycodone(Percocet, oxycontin) and hydromorphone amongst others.  Long term and even short term use of opiods can cause: Increased pain response Dependence Constipation Depression Respiratory depression And more.  Withdrawal symptoms can include Flu like symptoms Nausea, vomiting And more Techniques to manage these symptoms Hydrate well Eat regular healthy meals Stay active Use relaxation techniques(deep breathing, meditating, yoga) Do Not substitute Alcohol to help with tapering If you have been on opioids for less than two weeks and do not have pain than it is ok to stop all together.  Plan to wean off of opioids This plan should start within one week post op of your joint replacement. Maintain the same  interval or time between taking each dose and first decrease the dose.  Cut the total daily intake of opioids by one tablet each day Next start to increase the time between doses. The last dose that should be eliminated is the evening dose.    SKILLED REHAB INSTRUCTIONS: If the patient is transferred to a skilled rehab facility following release from the hospital, a list of the current medications will be sent to the facility for the patient to continue.  When discharged from the skilled rehab facility, please have the facility set up the patient's Home Health Physical Therapy prior to being released. Also, the skilled facility will be responsible for providing the patient with their medications at time of release from the facility to include their pain medication, the muscle relaxants, and their blood thinner medication. If the patient is still at the rehab facility at time of the two week follow up appointment, the skilled rehab facility will also need to assist the patient in arranging follow up appointment in our office and any transportation needs.  MAKE SURE YOU:  Understand these instructions.  Will watch   your condition.  Will get help right away if you are not doing well or get worse.    Pick up stool softner and laxative for home use following surgery while on pain medications. Do NOT remove your dressing. You may shower.  Do not take tub baths or submerge incision under water. May shower starting three days after surgery. Please use a clean towel to pat the incision dry following showers. Continue to use ice for pain and swelling after surgery. Do not use any lotions or creams on the incision until instructed by your surgeon.  

## 2022-07-24 NOTE — Interval H&P Note (Signed)
History and Physical Interval Note:  07/24/2022 7:22 AM  Robert Knapp  has presented today for surgery, with the diagnosis of Right knee osteoarthritis.  The various methods of treatment have been discussed with the patient and family. After consideration of risks, benefits and other options for treatment, the patient has consented to  Procedure(s) with comments: COMPUTER ASSISTED TOTAL KNEE ARTHROPLASTY (Right) - 160 as a surgical intervention.  The patient's history has been reviewed, patient examined, no change in status, stable for surgery.  I have reviewed the patient's chart and labs.  Questions were answered to the patient's satisfaction.     Iline Oven Torianna Junio

## 2022-07-24 NOTE — Op Note (Signed)
OPERATIVE REPORT  SURGEON: Samson Frederic, MD   ASSISTANT: Clint Bolder, PA-C  PREOPERATIVE DIAGNOSIS: Primary Right knee arthritis.   POSTOPERATIVE DIAGNOSIS: Primary Right knee arthritis.   PROCEDURE: Computer assisted Right total knee arthroplasty.   IMPLANTS: Zimmer Persona PPS Cementless CR femur, size 11. Persona 0 degree Spiked Keel OsseoTi Tibia, size H. Vivacit-E polyethelyene insert, size 10 mm, CR. TM standard patella, size 41 mm.  ANESTHESIA:  MAC, Regional, and Spinal  TOURNIQUET TIME: Not utilized.   ESTIMATED BLOOD LOSS:-400 mL    ANTIBIOTICS: 3 g Ancef.  DRAINS: None.  COMPLICATIONS: None   CONDITION: PACU - hemodynamically stable.   BRIEF CLINICAL NOTE: Robert Knapp is a 67 y.o. male with a long-standing history of Right knee arthritis. After failing conservative management, the patient was indicated for total knee arthroplasty. The risks, benefits, and alternatives to the procedure were explained, and the patient elected to proceed.  PROCEDURE IN DETAIL: Adductor canal block was obtained in the pre-op holding area. Once inside the operative room, spinal anesthesia was obtained, and a foley catheter was inserted. The patient was then positioned and the lower extremity was prepped and draped in the normal sterile surgical fashion.  A time-out was called verifying side and site of surgery. The patient received IV antibiotics within 60 minutes of beginning the procedure. A tourniquet was not utilized.   An anterior approach to the knee was performed utilizing a midvastus arthrotomy. A medial release was performed and the patellar fat pad was excised. Stryker imageless navigation was used to cut the distal femur perpendicular to the mechanical axis. A freehand patellar resection was performed, and the patella was sized an prepared with a lug hole.  Nagivation was used to make a neutral proximal tibia resection, taking 3 mm of bone from the less affected medial side  with 3 degrees of slope. The menisci were excised. A spacer block was placed, and the alignment and balance in extension were confirmed.   The distal femur was sized using the 3-degree external rotation guide referencing the posterior femoral cortex. The appropriate 4-in-1 cutting block was pinned into place. Rotation was checked using Whiteside's line, the epicondylar axis, and then confirmed with a spacer block in flexion. The remaining femoral cuts were performed, taking care to protect the MCL.  The tibia was sized and the trial tray was pinned into place. The remaining trail components were inserted. The knee was stable to varus and valgus stress through a full range of motion. The patella tracked centrally, and the PCL was well balanced. The trial components were removed, and the proximal tibial surface was prepared. Final components were impacted into place. The knee was tested for a final time and found to be well balanced.   The wound was copiously irrigated with Prontosan solution and normal saline using pulse lavage.  Marcaine solution was injected into the periarticular soft tissue.  The wound was closed in layers using #1 Vicryl and Stratafix for the fascia, 2-0 Vicryl for the subcutaneous fat, 2-0 Monocryl for the deep dermal layer, 3-0 running Monocryl subcuticular Stitch, and 4-0 Monocryl stay sutures at both ends of the wound. Dermabond was applied to the skin.  Once the glue was fully dried, an Aquacell Ag and compressive dressing were applied.  The patient was transported to the recovery room in stable condition.  Sponge, needle, and instrument counts were correct at the end of the case x2.  The patient tolerated the procedure well and there were no known  complications.  The aquamantis was utilized for this case to help facilitate better hemostasis as patient was felt to be at increased risk of bleeding because of obesity & tourniquet free surgery.  A oscillating saw tip was utilized for  this case to prevent damage to the soft tissue structures such as muscles, ligaments and tendons, and to ensure accurate bone cuts. This patient was at increased risk for above structures due to  minimally invasive approach.  Please note that a surgical assistant was a medical necessity for this procedure in order to perform it in a safe and expeditious manner. Surgical assistant was necessary to retract the ligaments and vital neurovascular structures to prevent injury to them and also necessary for proper positioning of the limb to allow for anatomic placement of the prosthesis.

## 2022-07-24 NOTE — Anesthesia Postprocedure Evaluation (Signed)
Anesthesia Post Note  Patient: Robert Knapp  Procedure(s) Performed: COMPUTER ASSISTED TOTAL KNEE ARTHROPLASTY (Right: Knee)     Patient location during evaluation: PACU Anesthesia Type: Spinal Level of consciousness: awake, awake and alert and oriented Pain management: pain level controlled Vital Signs Assessment: post-procedure vital signs reviewed and stable Respiratory status: spontaneous breathing, nonlabored ventilation and respiratory function stable Cardiovascular status: blood pressure returned to baseline and stable Postop Assessment: no headache, no backache, spinal receding and no apparent nausea or vomiting Anesthetic complications: no   No notable events documented.  Last Vitals:  Vitals:   07/24/22 1430 07/24/22 1500  BP: 116/71 118/76  Pulse: (!) 52 (!) 59  Resp: 14   Temp:    SpO2: 96% 96%    Last Pain:  Vitals:   07/24/22 1531  TempSrc:   PainSc: 0-No pain                 Collene Schlichter

## 2022-07-24 NOTE — Anesthesia Procedure Notes (Signed)
Spinal  Patient location during procedure: OR Start time: 07/24/2022 7:30 AM End time: 07/24/2022 7:33 AM Reason for block: surgical anesthesia Staffing Performed: anesthesiologist  Anesthesiologist: Collene Schlichter, MD Performed by: Collene Schlichter, MD Authorized by: Collene Schlichter, MD   Preanesthetic Checklist Completed: patient identified, IV checked, risks and benefits discussed, surgical consent, monitors and equipment checked, pre-op evaluation and timeout performed Spinal Block Patient position: sitting Prep: DuraPrep and site prepped and draped Patient monitoring: continuous pulse ox and blood pressure Approach: midline Location: L3-4 Injection technique: single-shot Needle Needle type: Pencan  Needle gauge: 24 G Assessment Events: CSF return Additional Notes Functioning IV was confirmed and monitors were applied. Sterile prep and drape, including hand hygiene, mask and sterile gloves were used. The patient was positioned and the spine was prepped. The skin was anesthetized with lidocaine.  Free flow of clear CSF was obtained prior to injecting local anesthetic into the CSF.  The spinal needle aspirated freely following injection.  The needle was carefully withdrawn.  The patient tolerated the procedure well. Consent was obtained prior to procedure with all questions answered and concerns addressed. Risks including but not limited to bleeding, infection, nerve damage, paralysis, failed block, inadequate analgesia, allergic reaction, high spinal, itching and headache were discussed and the patient wished to proceed.   Robert Aran, MD

## 2022-07-24 NOTE — Anesthesia Procedure Notes (Addendum)
Anesthesia Regional Block: Adductor canal block   Pre-Anesthetic Checklist: , timeout performed,  Correct Patient, Correct Site, Correct Laterality,  Correct Procedure, Correct Position, site marked,  Risks and benefits discussed,  Surgical consent,  Pre-op evaluation,  At surgeon's request and post-op pain management  Laterality: Right  Prep: chloraprep       Needles:  Injection technique: Single-shot  Needle Type: Echogenic Needle     Needle Length: 9cm  Needle Gauge: 21     Additional Needles:   Procedures:,,,, ultrasound used (permanent image in chart),,    Narrative:  Start time: 07/24/2022 6:55 AM End time: 07/24/2022 7:05 AM Injection made incrementally with aspirations every 5 mL.  Performed by: Personally  Anesthesiologist: Collene Schlichter, MD  Additional Notes: No pain on injection. No increased resistance to injection. Injection made in 5cc increments.  Good needle visualization.  Patient tolerated procedure well.

## 2022-07-24 NOTE — Transfer of Care (Signed)
Immediate Anesthesia Transfer of Care Note  Patient: Robert Knapp  Procedure(s) Performed: COMPUTER ASSISTED TOTAL KNEE ARTHROPLASTY (Right: Knee)  Patient Location: PACU  Anesthesia Type:MAC combined with regional for post-op pain  Level of Consciousness: awake, sedated, drowsy, and patient cooperative  Airway & Oxygen Therapy: Patient Spontanous Breathing and Patient connected to face mask oxygen  Post-op Assessment: Report given to RN, Post -op Vital signs reviewed and stable, and Patient moving all extremities X 4  Post vital signs: stable  Last Vitals:  Vitals Value Taken Time  BP    Temp    Pulse 65 07/24/22 1021  Resp 15 07/24/22 1021  SpO2 93 % 07/24/22 1021  Vitals shown include unvalidated device data.  Last Pain:  Vitals:   07/24/22 0616  TempSrc:   PainSc: 0-No pain      Patients Stated Pain Goal: 4 (07/24/22 1191)  Complications: No notable events documented.

## 2022-07-25 ENCOUNTER — Encounter (HOSPITAL_COMMUNITY): Payer: Self-pay | Admitting: Orthopedic Surgery

## 2022-07-30 DIAGNOSIS — R262 Difficulty in walking, not elsewhere classified: Secondary | ICD-10-CM | POA: Diagnosis not present

## 2022-07-30 DIAGNOSIS — M25561 Pain in right knee: Secondary | ICD-10-CM | POA: Diagnosis not present

## 2022-08-04 DIAGNOSIS — R262 Difficulty in walking, not elsewhere classified: Secondary | ICD-10-CM | POA: Diagnosis not present

## 2022-08-04 DIAGNOSIS — M25561 Pain in right knee: Secondary | ICD-10-CM | POA: Diagnosis not present

## 2022-08-06 DIAGNOSIS — M25561 Pain in right knee: Secondary | ICD-10-CM | POA: Diagnosis not present

## 2022-08-06 DIAGNOSIS — R262 Difficulty in walking, not elsewhere classified: Secondary | ICD-10-CM | POA: Diagnosis not present

## 2022-08-08 ENCOUNTER — Other Ambulatory Visit (HOSPITAL_COMMUNITY): Payer: Self-pay

## 2022-08-08 DIAGNOSIS — Z471 Aftercare following joint replacement surgery: Secondary | ICD-10-CM | POA: Diagnosis not present

## 2022-08-08 DIAGNOSIS — Z96651 Presence of right artificial knee joint: Secondary | ICD-10-CM | POA: Diagnosis not present

## 2022-08-08 MED ORDER — OXYCODONE HCL 5 MG PO TABS
ORAL_TABLET | ORAL | 0 refills | Status: DC
  Filled 2022-08-08: qty 42, 7d supply, fill #0

## 2022-08-12 DIAGNOSIS — R262 Difficulty in walking, not elsewhere classified: Secondary | ICD-10-CM | POA: Diagnosis not present

## 2022-08-12 DIAGNOSIS — M25561 Pain in right knee: Secondary | ICD-10-CM | POA: Diagnosis not present

## 2022-08-13 DIAGNOSIS — R262 Difficulty in walking, not elsewhere classified: Secondary | ICD-10-CM | POA: Diagnosis not present

## 2022-08-13 DIAGNOSIS — M25561 Pain in right knee: Secondary | ICD-10-CM | POA: Diagnosis not present

## 2022-08-20 DIAGNOSIS — M25561 Pain in right knee: Secondary | ICD-10-CM | POA: Diagnosis not present

## 2022-08-20 DIAGNOSIS — R262 Difficulty in walking, not elsewhere classified: Secondary | ICD-10-CM | POA: Diagnosis not present

## 2022-08-21 DIAGNOSIS — M25561 Pain in right knee: Secondary | ICD-10-CM | POA: Diagnosis not present

## 2022-08-21 DIAGNOSIS — R262 Difficulty in walking, not elsewhere classified: Secondary | ICD-10-CM | POA: Diagnosis not present

## 2022-08-26 DIAGNOSIS — M25561 Pain in right knee: Secondary | ICD-10-CM | POA: Diagnosis not present

## 2022-08-26 DIAGNOSIS — R262 Difficulty in walking, not elsewhere classified: Secondary | ICD-10-CM | POA: Diagnosis not present

## 2022-08-28 DIAGNOSIS — M25561 Pain in right knee: Secondary | ICD-10-CM | POA: Diagnosis not present

## 2022-08-28 DIAGNOSIS — R262 Difficulty in walking, not elsewhere classified: Secondary | ICD-10-CM | POA: Diagnosis not present

## 2022-09-01 DIAGNOSIS — R262 Difficulty in walking, not elsewhere classified: Secondary | ICD-10-CM | POA: Diagnosis not present

## 2022-09-01 DIAGNOSIS — M25561 Pain in right knee: Secondary | ICD-10-CM | POA: Diagnosis not present

## 2022-09-02 DIAGNOSIS — Z96651 Presence of right artificial knee joint: Secondary | ICD-10-CM | POA: Diagnosis not present

## 2022-09-03 DIAGNOSIS — M25561 Pain in right knee: Secondary | ICD-10-CM | POA: Diagnosis not present

## 2022-09-03 DIAGNOSIS — R262 Difficulty in walking, not elsewhere classified: Secondary | ICD-10-CM | POA: Diagnosis not present

## 2022-09-08 DIAGNOSIS — R262 Difficulty in walking, not elsewhere classified: Secondary | ICD-10-CM | POA: Diagnosis not present

## 2022-09-08 DIAGNOSIS — M25561 Pain in right knee: Secondary | ICD-10-CM | POA: Diagnosis not present

## 2022-09-10 DIAGNOSIS — R262 Difficulty in walking, not elsewhere classified: Secondary | ICD-10-CM | POA: Diagnosis not present

## 2022-09-10 DIAGNOSIS — M25561 Pain in right knee: Secondary | ICD-10-CM | POA: Diagnosis not present

## 2022-09-15 DIAGNOSIS — M25561 Pain in right knee: Secondary | ICD-10-CM | POA: Diagnosis not present

## 2022-09-15 DIAGNOSIS — R262 Difficulty in walking, not elsewhere classified: Secondary | ICD-10-CM | POA: Diagnosis not present

## 2022-09-17 DIAGNOSIS — M25561 Pain in right knee: Secondary | ICD-10-CM | POA: Diagnosis not present

## 2022-09-17 DIAGNOSIS — R262 Difficulty in walking, not elsewhere classified: Secondary | ICD-10-CM | POA: Diagnosis not present

## 2022-09-26 ENCOUNTER — Other Ambulatory Visit (HOSPITAL_COMMUNITY): Payer: Self-pay

## 2022-09-30 DIAGNOSIS — R262 Difficulty in walking, not elsewhere classified: Secondary | ICD-10-CM | POA: Diagnosis not present

## 2022-09-30 DIAGNOSIS — M25561 Pain in right knee: Secondary | ICD-10-CM | POA: Diagnosis not present

## 2022-10-06 ENCOUNTER — Other Ambulatory Visit (HOSPITAL_COMMUNITY): Payer: Self-pay

## 2022-10-06 DIAGNOSIS — M1990 Unspecified osteoarthritis, unspecified site: Secondary | ICD-10-CM | POA: Diagnosis not present

## 2022-10-06 DIAGNOSIS — Z6836 Body mass index (BMI) 36.0-36.9, adult: Secondary | ICD-10-CM | POA: Diagnosis not present

## 2022-10-06 DIAGNOSIS — Z008 Encounter for other general examination: Secondary | ICD-10-CM | POA: Diagnosis not present

## 2022-10-06 DIAGNOSIS — Z791 Long term (current) use of non-steroidal anti-inflammatories (NSAID): Secondary | ICD-10-CM | POA: Diagnosis not present

## 2022-10-07 DIAGNOSIS — R262 Difficulty in walking, not elsewhere classified: Secondary | ICD-10-CM | POA: Diagnosis not present

## 2022-10-07 DIAGNOSIS — M25561 Pain in right knee: Secondary | ICD-10-CM | POA: Diagnosis not present

## 2022-10-09 DIAGNOSIS — Z471 Aftercare following joint replacement surgery: Secondary | ICD-10-CM | POA: Diagnosis not present

## 2022-10-09 DIAGNOSIS — Z96651 Presence of right artificial knee joint: Secondary | ICD-10-CM | POA: Diagnosis not present

## 2022-10-10 ENCOUNTER — Other Ambulatory Visit (HOSPITAL_COMMUNITY): Payer: Self-pay

## 2022-10-14 ENCOUNTER — Other Ambulatory Visit (HOSPITAL_COMMUNITY): Payer: Self-pay

## 2022-10-15 ENCOUNTER — Other Ambulatory Visit (HOSPITAL_COMMUNITY): Payer: Self-pay

## 2022-12-19 DIAGNOSIS — I83893 Varicose veins of bilateral lower extremities with other complications: Secondary | ICD-10-CM | POA: Diagnosis not present

## 2022-12-19 DIAGNOSIS — S81801A Unspecified open wound, right lower leg, initial encounter: Secondary | ICD-10-CM | POA: Diagnosis not present

## 2023-01-20 DIAGNOSIS — N401 Enlarged prostate with lower urinary tract symptoms: Secondary | ICD-10-CM | POA: Diagnosis not present

## 2023-01-20 DIAGNOSIS — R972 Elevated prostate specific antigen [PSA]: Secondary | ICD-10-CM | POA: Diagnosis not present

## 2023-01-23 DIAGNOSIS — S81801A Unspecified open wound, right lower leg, initial encounter: Secondary | ICD-10-CM | POA: Diagnosis not present

## 2023-04-21 DIAGNOSIS — Z96651 Presence of right artificial knee joint: Secondary | ICD-10-CM | POA: Diagnosis not present

## 2023-04-21 DIAGNOSIS — M1712 Unilateral primary osteoarthritis, left knee: Secondary | ICD-10-CM | POA: Diagnosis not present

## 2023-05-04 ENCOUNTER — Other Ambulatory Visit (HOSPITAL_COMMUNITY): Payer: Self-pay

## 2023-06-25 ENCOUNTER — Ambulatory Visit: Payer: Medicare HMO | Admitting: Gastroenterology

## 2023-07-14 ENCOUNTER — Encounter: Payer: Self-pay | Admitting: Gastroenterology

## 2023-07-14 ENCOUNTER — Ambulatory Visit: Admitting: Gastroenterology

## 2023-07-14 VITALS — BP 112/68 | HR 68 | Ht 75.0 in | Wt 298.1 lb

## 2023-07-14 DIAGNOSIS — K625 Hemorrhage of anus and rectum: Secondary | ICD-10-CM

## 2023-07-14 DIAGNOSIS — Z860100 Personal history of colon polyps, unspecified: Secondary | ICD-10-CM

## 2023-07-14 DIAGNOSIS — Z860101 Personal history of adenomatous and serrated colon polyps: Secondary | ICD-10-CM | POA: Diagnosis not present

## 2023-07-14 DIAGNOSIS — K219 Gastro-esophageal reflux disease without esophagitis: Secondary | ICD-10-CM

## 2023-07-14 DIAGNOSIS — Z800 Family history of malignant neoplasm of digestive organs: Secondary | ICD-10-CM | POA: Diagnosis not present

## 2023-07-14 MED ORDER — OMEPRAZOLE 20 MG PO CPDR: 20.0000 mg | DELAYED_RELEASE_CAPSULE | Freq: Every day | ORAL | 3 refills | Status: AC

## 2023-07-14 NOTE — Progress Notes (Signed)
 Chief Complaint: FU  Referring Provider:  Adrian Hopper, MD      ASSESSMENT AND PLAN;   #1. Rectal bleeding- likely d/t Hoids (resolved)  #2. FH CRC (mom at age 66)  #3. H/O polyps  #4. GERD  Plan: -omeprazole  20mg  po every day #90, 3RF -Benefiber 1TBS po every day with 8oz water  -Recall colon 09/2024. Earlier if problems. -Use prep H PRN x 7-10 days. -Call if any problems. HPI:    Robert Knapp is a 68 y.o. male   Discussed the use of AI scribe software for clinical note transcription with the patient, who gave verbal consent to proceed.  History of Present Illness Robert Knapp is a 68 year old male who presents with rectal bleeding and preparation for knee surgery.  He experienced a single episode of rectal bleeding during a period of constipation, which he attributes to hemorrhoids. There is a family history of colon cancer, as his mother had the condition. He is scheduled for a colonoscopy next year due to this family history. No black stools or current heartburn. Regular bowel movements without straining.  He is preparing for a knee replacement surgery on May 7th, having had a previous knee replacement on May 2nd of the previous year. He anticipates being on blood thinners post-surgery for about three months. He currently takes Celebrex  and aspirin  for his knee condition. Has pre-op labs including CBC scheduled.  He has a history of heartburn, which resolved after he stopped drinking sodas and switched to water  and Gatorade.   Prev GI [facility]  Colon 09/2021 ([facility]) - Two 6 mm polyps in the proximal transverse colon and in the mid transverse colon, removed with a cold snare. Resected and retrieved. - Mild sigmoid diverticulosis. - Non- bleeding internal hemorrhoids. - The examination was otherwise normal on direct and retroflexion views. -Bx: SSA and TA -Rpt 3 yrs d/t FH CRC (mom at age 8)  Past Medical History:  Diagnosis Date   Allergy    Arthritis     Past  Surgical History:  Procedure Laterality Date   COLONOSCOPY  04/14/2016   KNEE ARTHROPLASTY Right 07/24/2022   Procedure: COMPUTER ASSISTED TOTAL KNEE ARTHROPLASTY;  Surgeon: Adonica Hoose, MD;  Location: [facility];  Service: Orthopedics;  Laterality: Right;  160   NO PAST SURGERIES     POLYPECTOMY      Family History  Problem Relation Age of Onset   Heart disease Mother    Colon cancer Mother 31   Colon polyps Neg Hx    Esophageal cancer Neg Hx    Rectal cancer Neg Hx    Stomach cancer Neg Hx    Pancreatic cancer Neg Hx     Social History   Tobacco Use   Smoking status: Never   Smokeless tobacco: Never  Vaping Use   Vaping status: Never Used  Substance Use Topics   Alcohol use: Not Currently   Drug use: Never    Current Outpatient Medications  Medication Sig Dispense Refill   calcium carbonate (TUMS - DOSED IN MG ELEMENTAL CALCIUM) 500 MG chewable tablet Chew 1-2 tablets by mouth daily as needed for indigestion or heartburn.     celecoxib  (CELEBREX ) 200 MG capsule Take 1 capsule by mouth 2 times daily. 180 capsule 3   cetirizine (ZYRTEC) 10 MG tablet Take 10 mg by mouth daily as needed for allergies.     omeprazole  (PRILOSEC) 20 MG capsule Take 1 capsule (20 mg total) by mouth  daily. 90 capsule 3   No current facility-administered medications for this visit.    No Known Allergies  Review of Systems:  Constitutional: Denies fever, chills, diaphoresis, appetite change and fatigue.  HEENT: Denies photophobia, eye pain, redness, hearing loss, ear pain, congestion, sore throat, rhinorrhea, sneezing, mouth sores, neck pain, neck stiffness and tinnitus.   Respiratory: Denies SOB, DOE, cough, chest tightness,  and wheezing.   Cardiovascular: Denies chest pain, palpitations and leg swelling.  Genitourinary: Denies dysuria, urgency, frequency, hematuria, flank pain and difficulty urinating.  Musculoskeletal: Denies myalgias, back pain, joint swelling, arthralgias and gait  problem.  Skin: No rash.  Neurological: Denies dizziness, seizures, syncope, weakness, light-headedness, numbness and headaches.  Hematological: Denies adenopathy. Easy bruising, personal or family bleeding history  Psychiatric/Behavioral: No anxiety or depression     Physical Exam:    BP 112/68   Pulse 68   Ht 6\' 3"  (1.905 m)   Wt 298 lb 2 oz (135.2 kg)   SpO2 95%   BMI 37.26 kg/m  Wt Readings from Last 3 Encounters:  07/14/23 298 lb 2 oz (135.2 kg)  07/24/22 298 lb (135.2 kg)  07/15/22 298 lb (135.2 kg)   Constitutional:  Well-developed, in no acute distress. Psychiatric: Normal mood and affect. Behavior is normal. HEENT: Pupils normal.  Conjunctivae are normal. No scleral icterus. Neck supple.  Cardiovascular: Normal rate, regular rhythm. No edema Pulmonary/chest: Effort normal and breath sounds normal. No wheezing, rales or rhonchi. Abdominal: Soft, nondistended. Nontender. Bowel sounds active throughout. There are no masses palpable. No hepatomegaly. Mod umblical hernia Rectal: Deferred Neurological: Alert and oriented to person place and time. Skin: Skin is warm and dry. No rashes noted.  Data Reviewed: I have personally reviewed following labs and imaging studies  CBC:    Latest Ref Rng & Units 07/15/2022   10:55 AM  CBC  WBC 4.0 - 10.5 K/uL 4.4   Hemoglobin 13.0 - 17.0 g/dL 56.2   Hematocrit 13.0 - 52.0 % 44.1   Platelets 150 - 400 K/uL 175       Magnus Schuller, MD 07/14/2023, 5:53 PM  Cc: Adrian Hopper, MD

## 2023-07-14 NOTE — Patient Instructions (Addendum)
 _______________________________________________________  If your blood pressure at your visit was 140/90 or greater, please contact your primary care physician to follow up on this. _______________________________________________________  If you are age 66 or older, your body mass index should be between 23-30. Your Body mass index is 37.26 kg/m. If this is out of the aforementioned range listed, please consider follow up with your Primary Care Provider. ________________________________________________________  The [facility] providers would like to encourage you to use MYCHART to communicate with providers for non-urgent requests or questions.  Due to long hold times on the telephone, sending your provider a message by [facility] may be a faster and more efficient way to get a response.  Please allow 48 business hours for a response.  Please remember that this is for non-urgent requests.  _______________________________________________________  We have sent the following medications to your pharmacy for you to pick up at your convenience: START: omeprazole  20mg  one capsule daily  Please purchase the following medications over the counter and take as directed:  START: omeprazole  20mg  1 tablespoon every in 8 ounces of water .  Please follow up in our office on an as needed basis.  Thank you for entrusting me with your care and choosing [facility].  Dr Venice Gillis

## 2023-07-17 ENCOUNTER — Ambulatory Visit: Payer: Self-pay | Admitting: Student

## 2023-07-17 NOTE — Progress Notes (Signed)
 Surgery orders requested via Epic inbox.

## 2023-07-23 NOTE — Progress Notes (Addendum)
 COVID Vaccine received:  []  No [x]  Yes Date of any COVID positive Test in last 90 days: no PCP - Maria Shiner MD Cardiologist - n/a  Chest x-ray -  EKG -   Stress Test -  ECHO -  Cardiac Cath -   Bowel Prep - [x]  No  []   Yes ______  Pacemaker / ICD device [x]  No []  Yes   Spinal Cord Stimulator:[x]  No []  Yes       History of Sleep Apnea? [x]  No []  Yes   CPAP used?- [x]  No []  Yes    Does the patient monitor blood sugar?          [x]  No []  Yes  []  N/A  Patient has: []  NO Hx DM   []  Pre-DM                 []  DM1  []   DM2 Does patient have a [employer] or Dexacom? []  No []  Yes   Fasting Blood Sugar Ranges-  Checks Blood Sugar _____ times a day  GLP1 agonist / usual dose - no GLP1 instructions:  SGLT-2 inhibitors / usual dose - no SGLT-2 instructions:   Blood Thinner / Instructions:no Aspirin  Instructions:no  Comments:   Activity level: Patient is able o climb a flight of stairs without difficulty; [x]  No CP   No SOB,   Patient can perform ADLs without assistance.   Anesthesia review:   Patient denies shortness of breath, fever, cough and chest pain at PAT appointment.  Patient verbalized understanding and agreement to the Pre-Surgical Instructions that were given to them at this PAT appointment. Patient was also educated of the need to review these PAT instructions again prior to his/her surgery.I reviewed the appropriate phone numbers to call if they have any and questions or concerns.

## 2023-07-23 NOTE — Patient Instructions (Signed)
 SURGICAL WAITING ROOM VISITATION  Patients having surgery or a procedure may have no more than 2 support people in the waiting area - these visitors may rotate.    Children under the age of 28 must have an adult with them who is not the patient.  Due to an increase in RSV and influenza rates and associated hospitalizations, children ages 106 and under may not visit patients in [facility] hospitals.  Visitors with respiratory illnesses are discouraged from visiting and should remain at home.  If the patient needs to stay at the hospital during part of their recovery, the visitor guidelines for inpatient rooms apply. Pre-op nurse will coordinate an appropriate time for 1 support person to accompany patient in pre-op.  This support person may not rotate.    Please refer to the [facility] website for the visitor guidelines for Inpatients (after your surgery is over and you are in a regular room).       Your procedure is scheduled on: 07/29/23   Report to [facility] Main Entrance    Report to admitting at 6 AM   Call this number if you have problems the morning of surgery [phone]   Do not eat food :After Midnight.   After Midnight you may have the following liquids until 5:30 AM DAY OF SURGERY  Water  Non-Citrus Juices (without pulp, NO RED-Apple, White grape, White cranberry) Black Coffee (NO MILK/CREAM OR CREAMERS, sugar ok)  Clear Tea (NO MILK/CREAM OR CREAMERS, sugar ok) regular and decaf                             Plain Jell-O (NO RED)                                           Fruit ices (not with fruit pulp, NO RED)                                     Popsicles (NO RED)                                                               Sports drinks like Gatorade (NO RED)                The day of surgery:  Drink ONE (1) Pre-Surgery Clear Ensure at 5:30 AM the morning of surgery. Drink in one sitting. Do not sip.  This drink was given to you during your hospital   pre-op appointment visit. Nothing else to drink after completing the  Pre-Surgery Clear Ensure .    Oral Hygiene is also important to reduce your risk of infection.                                    Remember - BRUSH YOUR TEETH THE MORNING OF SURGERY WITH YOUR REGULAR TOOTHPASTE  DENTURES WILL BE REMOVED PRIOR TO SURGERY PLEASE DO NOT APPLY "Poly grip" OR ADHESIVES!!!   Stop all vitamins and  herbal supplements 7 days before surgery.   Take these medicines the morning of surgery with A SIP OF WATER : Omeprazole (prilosec)             You may not have any metal on your body including hair pins, jewelry, and body piercing  Do not shave  48 hours prior to surgery.               Men may shave face and neck.   Do not bring valuables to the hospital. [facility] IS NOT             RESPONSIBLE   FOR VALUABLES.   Contacts, glasses, dentures or bridgework may not be worn into surgery.  DO NOT BRING YOUR HOME MEDICATIONS TO THE HOSPITAL. PHARMACY WILL DISPENSE MEDICATIONS LISTED ON YOUR MEDICATION LIST TO YOU DURING YOUR ADMISSION IN THE HOSPITAL!    Patients discharged on the day of surgery will not be allowed to drive home.  Someone NEEDS to stay with you for the first 24 hours after anesthesia.   Special Instructions: Bring a copy of your healthcare power of attorney and living will documents the day of surgery if you haven't scanned them before.              Please read over the following fact sheets you were given: IF YOU HAVE QUESTIONS ABOUT YOUR PRE-OP INSTRUCTIONS PLEASE CALL [phone] Ammon Bales   If you received a COVID test during your pre-op visit  it is requested that you wear a mask when out in public, stay away from anyone that may not be feeling well and notify your surgeon if you develop symptoms. If you test positive for Covid or have been in contact with anyone that has tested positive in the last 10 days please notify you surgeon.      Pre-operative 5 CHG Bath  Instructions   You can play a key role in reducing the risk of infection after surgery. Your skin needs to be as free of germs as possible. You can reduce the number of germs on your skin by washing with CHG (chlorhexidine  gluconate) soap before surgery. CHG is an antiseptic soap that kills germs and continues to kill germs even after washing.   DO NOT use if you have an allergy to chlorhexidine /CHG or antibacterial soaps. If your skin becomes reddened or irritated, stop using the CHG and notify one of our RNs at [phone].   Please shower with the CHG soap starting 4 days before surgery using the following schedule:     Please keep in mind the following:  DO NOT shave, including legs and underarms, starting the day of your first shower.   You may shave your face at any point before/day of surgery.  Place clean sheets on your bed the day you start using CHG soap. Use a clean washcloth (not used since being washed) for each shower. DO NOT sleep with pets once you start using the CHG.   CHG Shower Instructions:  If you choose to wash your hair and private area, wash first with your normal shampoo/soap.  After you use shampoo/soap, rinse your hair and body thoroughly to remove shampoo/soap residue.  Turn the water  OFF and apply about 3 tablespoons (45 ml) of CHG soap to a CLEAN washcloth.  Apply CHG soap ONLY FROM YOUR NECK DOWN TO YOUR TOES (washing for 3-5 minutes)  DO NOT use CHG soap on face, private areas, open wounds, or sores.  Pay special attention  to the area where your surgery is being performed.  If you are having back surgery, having someone wash your back for you may be helpful. Wait 2 minutes after CHG soap is applied, then you may rinse off the CHG soap.  Pat dry with a clean towel  Put on clean clothes/pajamas   If you choose to wear lotion, please use ONLY the CHG-compatible lotions on the back of this paper.     Additional instructions for the day of surgery: DO NOT  APPLY any lotions, deodorants, cologne, or perfumes.   Put on clean/comfortable clothes.  Brush your teeth.  Ask your nurse before applying any prescription medications to the skin.      CHG Compatible Lotions   Aveeno Moisturizing lotion  Cetaphil Moisturizing Cream  Cetaphil Moisturizing Lotion  Clairol Herbal Essence Moisturizing Lotion, Dry Skin  Clairol Herbal Essence Moisturizing Lotion, Extra Dry Skin  Clairol Herbal Essence Moisturizing Lotion, Normal Skin  Curel Age Defying Therapeutic Moisturizing Lotion with Alpha Hydroxy  Curel Extreme Care Body Lotion  Curel Soothing Hands Moisturizing Hand Lotion  Curel Therapeutic Moisturizing Cream, Fragrance-Free  Curel Therapeutic Moisturizing Lotion, Fragrance-Free  Curel Therapeutic Moisturizing Lotion, Original Formula  Eucerin Daily Replenishing Lotion  Eucerin Dry Skin Therapy Plus Alpha Hydroxy Crme  Eucerin Dry Skin Therapy Plus Alpha Hydroxy Lotion  Eucerin Original Crme  Eucerin Original Lotion  Eucerin Plus Crme Eucerin Plus Lotion  Eucerin TriLipid Replenishing Lotion  Keri Anti-Bacterial Hand Lotion  Keri Deep Conditioning Original Lotion Dry Skin Formula Softly Scented  Keri Deep Conditioning Original Lotion, Fragrance Free Sensitive Skin Formula  Keri Lotion Fast Absorbing Fragrance Free Sensitive Skin Formula  Keri Lotion Fast Absorbing Softly Scented Dry Skin Formula  Keri Original Lotion  Keri Skin Renewal Lotion Keri Silky Smooth Lotion  Keri Silky Smooth Sensitive Skin Lotion  Nivea Body Creamy Conditioning Oil  Nivea Body Extra Enriched Lotion  Nivea Body Original Lotion  Nivea Body Sheer Moisturizing Lotion Nivea Crme  Nivea Skin Firming Lotion  NutraDerm 30 Skin Lotion  NutraDerm Skin Lotion  NutraDerm Therapeutic Skin Cream  NutraDerm Therapeutic Skin Lotion  ProShield Protective Hand Cream   Incentive Spirometer  An incentive spirometer is a tool that can help keep your lungs clear and  active. This tool measures how well you are filling your lungs with each breath. Taking long deep breaths may help reverse or decrease the chance of developing breathing (pulmonary) problems (especially infection) following: A long period of time when you are unable to move or be active. BEFORE THE PROCEDURE  If the spirometer includes an indicator to show your best effort, your nurse or respiratory therapist will set it to a desired goal. If possible, sit up straight or lean slightly forward. Try not to slouch. Hold the incentive spirometer in an upright position. INSTRUCTIONS FOR USE  Sit on the edge of your bed if possible, or sit up as far as you can in bed or on a chair. Hold the incentive spirometer in an upright position. Breathe out normally. Place the mouthpiece in your mouth and seal your lips tightly around it. Breathe in slowly and as deeply as possible, raising the piston or the ball toward the top of the column. Hold your breath for 3-5 seconds or for as long as possible. Allow the piston or ball to fall to the bottom of the column. Remove the mouthpiece from your mouth and breathe out normally. Rest for a few seconds and repeat Steps 1 through  7 at least 10 times every 1-2 hours when you are awake. Take your time and take a few normal breaths between deep breaths. The spirometer may include an indicator to show your best effort. Use the indicator as a goal to work toward during each repetition. After each set of 10 deep breaths, practice coughing to be sure your lungs are clear. If you have an incision (the cut made at the time of surgery), support your incision when coughing by placing a pillow or rolled up towels firmly against it. Once you are able to get out of bed, walk around indoors and cough well. You may stop using the incentive spirometer when instructed by your caregiver.  RISKS AND COMPLICATIONS Take your time so you do not get dizzy or light-headed. If you are in pain,  you may need to take or ask for pain medication before doing incentive spirometry. It is harder to take a deep breath if you are having pain. AFTER USE Rest and breathe slowly and easily. It can be helpful to keep track of a log of your progress. Your caregiver can provide you with a simple table to help with this. If you are using the spirometer at home, follow these instructions: SEEK MEDICAL CARE IF:  You are having difficultly using the spirometer. You have trouble using the spirometer as often as instructed. Your pain medication is not giving enough relief while using the spirometer. You develop fever of 100.5 F (38.1 C) or higher. SEEK IMMEDIATE MEDICAL CARE IF:  You cough up bloody sputum that had not been present before. You develop fever of 102 F (38.9 C) or greater. You develop worsening pain at or near the incision site. MAKE SURE YOU:  Understand these instructions. Will watch your condition. Will get help right away if you are not doing well or get worse. Document Released: 07/21/2006 Document Revised: 06/02/2011 Document Reviewed: 09/21/2006 [facility] Patient Information 2014 [place], [place].

## 2023-07-24 ENCOUNTER — Encounter (HOSPITAL_COMMUNITY)
Admission: RE | Admit: 2023-07-24 | Discharge: 2023-07-24 | Disposition: A | Discharge status: Home / Self Care | Source: Ambulatory Visit | Attending: Orthopedic Surgery | Admitting: Orthopedic Surgery

## 2023-07-24 ENCOUNTER — Encounter (HOSPITAL_COMMUNITY): Payer: Self-pay

## 2023-07-24 VITALS — BP 127/77 | HR 60 | Temp 99.1°F | Resp 16 | Ht 74.0 in | Wt 293.0 lb

## 2023-07-24 DIAGNOSIS — Z01818 Encounter for other preprocedural examination: Secondary | ICD-10-CM | POA: Diagnosis present

## 2023-07-24 DIAGNOSIS — Z01812 Encounter for preprocedural laboratory examination: Secondary | ICD-10-CM | POA: Insufficient documentation

## 2023-07-24 LAB — CBC
HCT: 43.1 % (ref 39.0–52.0)
Hemoglobin: 14.4 g/dL (ref 13.0–17.0)
MCH: 32.3 pg (ref 26.0–34.0)
MCHC: 33.4 g/dL (ref 30.0–36.0)
MCV: 96.6 fL (ref 80.0–100.0)
Platelets: 202 10*3/uL (ref 150–400)
RBC: 4.46 MIL/uL (ref 4.22–5.81)
RDW: 12.7 % (ref 11.5–15.5)
WBC: 4.8 10*3/uL (ref 4.0–10.5)
nRBC: 0 % (ref 0.0–0.2)

## 2023-07-24 LAB — BASIC METABOLIC PANEL WITH GFR
Anion gap: 9 (ref 5–15)
BUN: 20 mg/dL (ref 8–23)
CO2: 23 mmol/L (ref 22–32)
Calcium: 9 mg/dL (ref 8.9–10.3)
Chloride: 109 mmol/L (ref 98–111)
Creatinine, Ser: 0.92 mg/dL (ref 0.61–1.24)
GFR, Estimated: >60 mL/min (ref >60)
Glucose, Bld: 93 mg/dL (ref 70–99)
Potassium: 4.2 mmol/L (ref 3.5–5.1)
Sodium: 141 mmol/L (ref 135–145)

## 2023-07-24 LAB — SURGICAL PCR SCREEN
MRSA, PCR: NEGATIVE
Staphylococcus aureus: NEGATIVE

## 2023-07-29 ENCOUNTER — Ambulatory Visit (HOSPITAL_COMMUNITY): Admitting: Anesthesiology

## 2023-07-29 ENCOUNTER — Encounter (HOSPITAL_COMMUNITY)
Admission: RE | Disposition: A | Discharge status: Home / Self Care | Payer: Self-pay | Source: Home / Self Care | Attending: Orthopedic Surgery

## 2023-07-29 ENCOUNTER — Encounter (HOSPITAL_COMMUNITY): Payer: Self-pay | Admitting: Orthopedic Surgery

## 2023-07-29 ENCOUNTER — Ambulatory Visit (HOSPITAL_COMMUNITY)

## 2023-07-29 ENCOUNTER — Ambulatory Visit: Payer: Self-pay | Admitting: Student

## 2023-07-29 ENCOUNTER — Ambulatory Visit (HOSPITAL_COMMUNITY)
Admission: RE | Admit: 2023-07-29 | Discharge: 2023-07-29 | Disposition: A | Discharge status: Home / Self Care | Payer: Medicare HMO | Attending: Orthopedic Surgery | Admitting: Orthopedic Surgery

## 2023-07-29 ENCOUNTER — Other Ambulatory Visit (HOSPITAL_COMMUNITY): Payer: Self-pay

## 2023-07-29 ENCOUNTER — Other Ambulatory Visit: Payer: Self-pay

## 2023-07-29 DIAGNOSIS — Z471 Aftercare following joint replacement surgery: Secondary | ICD-10-CM | POA: Diagnosis not present

## 2023-07-29 DIAGNOSIS — G8918 Other acute postprocedural pain: Secondary | ICD-10-CM | POA: Diagnosis not present

## 2023-07-29 DIAGNOSIS — Z6837 Body mass index (BMI) 37.0-37.9, adult: Secondary | ICD-10-CM | POA: Diagnosis not present

## 2023-07-29 DIAGNOSIS — R609 Edema, unspecified: Secondary | ICD-10-CM | POA: Diagnosis not present

## 2023-07-29 DIAGNOSIS — M1712 Unilateral primary osteoarthritis, left knee: Secondary | ICD-10-CM

## 2023-07-29 DIAGNOSIS — E66813 Obesity, class 3: Secondary | ICD-10-CM | POA: Insufficient documentation

## 2023-07-29 DIAGNOSIS — Z96652 Presence of left artificial knee joint: Secondary | ICD-10-CM | POA: Diagnosis not present

## 2023-07-29 HISTORY — PX: KNEE ARTHROPLASTY: SHX992

## 2023-07-29 SURGERY — ARTHROPLASTY, KNEE, TOTAL, USING IMAGELESS COMPUTER-ASSISTED NAVIGATION
Anesthesia: Monitor Anesthesia Care | Site: Knee | Laterality: Left

## 2023-07-29 MED ORDER — OXYCODONE HCL 5 MG/5ML PO SOLN: 5.0000 mg | Freq: Once | ORAL | Status: AC | PRN

## 2023-07-29 MED ORDER — OXYCODONE HCL 5 MG PO TABS: 10.0000 mg | ORAL_TABLET | ORAL | Status: DC | PRN

## 2023-07-29 MED ORDER — ORAL CARE MOUTH RINSE: 15.0000 mL | Freq: Once | OROMUCOSAL | Status: AC

## 2023-07-29 MED ORDER — LACTATED RINGERS IV BOLUS
500.0000 mL | Freq: Once | INTRAVENOUS | Status: AC
  Administered 2023-07-29: 500 mL via INTRAVENOUS

## 2023-07-29 MED ORDER — METOCLOPRAMIDE HCL 5 MG/ML IJ SOLN: 5.0000 mg | Freq: Three times a day (TID) | INTRAMUSCULAR | Status: DC | PRN

## 2023-07-29 MED ORDER — STERILE WATER FOR IRRIGATION IR SOLN
Status: DC | PRN
  Administered 2023-07-29: 1000 mL

## 2023-07-29 MED ORDER — ONDANSETRON HCL 4 MG/2ML IJ SOLN: 4.0000 mg | Freq: Four times a day (QID) | INTRAMUSCULAR | Status: DC | PRN

## 2023-07-29 MED ORDER — MIDAZOLAM HCL 5 MG/5ML IJ SOLN
INTRAMUSCULAR | Status: DC | PRN
  Administered 2023-07-29: 2 mg via INTRAVENOUS

## 2023-07-29 MED ORDER — ROPIVACAINE HCL 5 MG/ML IJ SOLN
INTRAMUSCULAR | Status: DC | PRN
Start: 2023-07-29 — End: 2023-07-29
  Administered 2023-07-29: 25 mL via PERINEURAL

## 2023-07-29 MED ORDER — METOCLOPRAMIDE HCL 5 MG PO TABS: 5.0000 mg | ORAL_TABLET | Freq: Three times a day (TID) | ORAL | Status: DC | PRN

## 2023-07-29 MED ORDER — ASPIRIN 81 MG PO CHEW
81.0000 mg | CHEWABLE_TABLET | Freq: Two times a day (BID) | ORAL | 0 refills | Status: AC
  Filled 2023-07-29: qty 90, 45d supply, fill #0

## 2023-07-29 MED ORDER — CEFAZOLIN SODIUM-DEXTROSE 2-4 GM/100ML-% IV SOLN
2.0000 g | Freq: Four times a day (QID) | INTRAVENOUS | Status: DC
  Administered 2023-07-29: 2 g via INTRAVENOUS

## 2023-07-29 MED ORDER — LACTATED RINGERS IV BOLUS
250.0000 mL | Freq: Once | INTRAVENOUS | Status: AC
  Administered 2023-07-29: 250 mL via INTRAVENOUS

## 2023-07-29 MED ORDER — METHOCARBAMOL 1000 MG/10ML IJ SOLN: 500.0000 mg | Freq: Four times a day (QID) | INTRAMUSCULAR | Status: DC | PRN

## 2023-07-29 MED ORDER — BUPIVACAINE IN DEXTROSE 0.75-8.25 % IT SOLN
INTRATHECAL | Status: DC | PRN
Start: 2023-07-29 — End: 2023-07-29
  Administered 2023-07-29: 2 mL via INTRATHECAL

## 2023-07-29 MED ORDER — OXYCODONE HCL 5 MG PO TABS
ORAL_TABLET | ORAL | Status: AC
Start: 2023-07-29 — End: 2023-07-29
  Administered 2023-07-29: 5 mg via ORAL
  Filled 2023-07-29: qty 1

## 2023-07-29 MED ORDER — ONDANSETRON HCL 4 MG/2ML IJ SOLN
INTRAMUSCULAR | Status: AC
  Filled 2023-07-29: qty 2

## 2023-07-29 MED ORDER — ACETAMINOPHEN 325 MG PO TABS: 325.0000 mg | ORAL_TABLET | Freq: Four times a day (QID) | ORAL | Status: DC | PRN

## 2023-07-29 MED ORDER — ACETAMINOPHEN 500 MG PO TABS
ORAL_TABLET | ORAL | Status: AC
  Administered 2023-07-29: 1000 mg via ORAL
  Filled 2023-07-29: qty 2

## 2023-07-29 MED ORDER — EPHEDRINE SULFATE-NACL 50-0.9 MG/10ML-% IV SOSY
PREFILLED_SYRINGE | INTRAVENOUS | Status: DC | PRN
  Administered 2023-07-29: 5 mg via INTRAVENOUS
  Administered 2023-07-29: 10 mg via INTRAVENOUS

## 2023-07-29 MED ORDER — OXYCODONE HCL 5 MG PO TABS: 5.0000 mg | ORAL_TABLET | Freq: Once | ORAL | Status: AC | PRN

## 2023-07-29 MED ORDER — ONDANSETRON HCL 4 MG PO TABS: 4.0000 mg | ORAL_TABLET | Freq: Four times a day (QID) | ORAL | Status: DC | PRN

## 2023-07-29 MED ORDER — PROPOFOL 1000 MG/100ML IV EMUL
INTRAVENOUS | Status: AC
  Filled 2023-07-29: qty 100

## 2023-07-29 MED ORDER — PROPOFOL 10 MG/ML IV BOLUS
INTRAVENOUS | Status: DC | PRN
  Administered 2023-07-29: 30 mg via INTRAVENOUS

## 2023-07-29 MED ORDER — ONDANSETRON HCL 4 MG/2ML IJ SOLN
INTRAMUSCULAR | Status: DC | PRN
Start: 2023-07-29 — End: 2023-07-29
  Administered 2023-07-29: 4 mg via INTRAVENOUS

## 2023-07-29 MED ORDER — SENNA 8.6 MG PO TABS
2.0000 | ORAL_TABLET | Freq: Every day | ORAL | 0 refills | Status: AC
  Filled 2023-07-29: qty 30, 15d supply, fill #0

## 2023-07-29 MED ORDER — POVIDONE-IODINE 10 % EX SWAB
2.0000 | Freq: Once | CUTANEOUS | Status: AC
  Administered 2023-07-29: 2 via TOPICAL

## 2023-07-29 MED ORDER — ACETAMINOPHEN 500 MG PO TABS: 1000.0000 mg | ORAL_TABLET | Freq: Four times a day (QID) | ORAL | Status: DC

## 2023-07-29 MED ORDER — CEFAZOLIN SODIUM-DEXTROSE 3-4 GM/150ML-% IV SOLN
3.0000 g | INTRAVENOUS | Status: AC
Start: 2023-07-29 — End: 2023-07-29
  Administered 2023-07-29: 3 g via INTRAVENOUS
  Filled 2023-07-29: qty 150

## 2023-07-29 MED ORDER — CEFAZOLIN SODIUM-DEXTROSE 2-4 GM/100ML-% IV SOLN
INTRAVENOUS | Status: DC
Start: 2023-07-29 — End: 2023-07-29
  Filled 2023-07-29: qty 100

## 2023-07-29 MED ORDER — DEXAMETHASONE SODIUM PHOSPHATE 10 MG/ML IJ SOLN
INTRAMUSCULAR | Status: DC | PRN
  Administered 2023-07-29: 10 mg via INTRAVENOUS

## 2023-07-29 MED ORDER — LACTATED RINGERS IV SOLN: INTRAVENOUS | Status: DC | PRN

## 2023-07-29 MED ORDER — FENTANYL CITRATE (PF) 100 MCG/2ML IJ SOLN
INTRAMUSCULAR | Status: AC
  Filled 2023-07-29: qty 2

## 2023-07-29 MED ORDER — METHOCARBAMOL 500 MG PO TABS
500.0000 mg | ORAL_TABLET | Freq: Four times a day (QID) | ORAL | 0 refills | Status: DC | PRN
Start: 2023-07-29 — End: 2023-10-02
  Filled 2023-07-29: qty 20, 5d supply, fill #0

## 2023-07-29 MED ORDER — ACETAMINOPHEN 500 MG PO TABS
1000.0000 mg | ORAL_TABLET | Freq: Once | ORAL | Status: AC
  Administered 2023-07-29: 1000 mg via ORAL
  Filled 2023-07-29: qty 2

## 2023-07-29 MED ORDER — KETOROLAC TROMETHAMINE 30 MG/ML IJ SOLN
INTRAMUSCULAR | Status: AC
  Filled 2023-07-29: qty 1

## 2023-07-29 MED ORDER — FENTANYL CITRATE PF 50 MCG/ML IJ SOSY: 25.0000 ug | PREFILLED_SYRINGE | INTRAMUSCULAR | Status: DC | PRN

## 2023-07-29 MED ORDER — LACTATED RINGERS IV SOLN: INTRAVENOUS | Status: DC

## 2023-07-29 MED ORDER — DOCUSATE SODIUM 100 MG PO CAPS
100.0000 mg | ORAL_CAPSULE | Freq: Two times a day (BID) | ORAL | 0 refills | Status: AC
  Filled 2023-07-29: qty 60, 30d supply, fill #0

## 2023-07-29 MED ORDER — HYDROMORPHONE HCL 1 MG/ML IJ SOLN: 0.5000 mg | INTRAMUSCULAR | Status: DC | PRN

## 2023-07-29 MED ORDER — OXYCODONE HCL 5 MG PO TABS: 5.0000 mg | ORAL_TABLET | ORAL | Status: DC | PRN

## 2023-07-29 MED ORDER — PROPOFOL 500 MG/50ML IV EMUL
INTRAVENOUS | Status: DC | PRN
  Administered 2023-07-29: 180 ug/kg/min via INTRAVENOUS

## 2023-07-29 MED ORDER — TRANEXAMIC ACID-NACL 1000-0.7 MG/100ML-% IV SOLN
1000.0000 mg | INTRAVENOUS | Status: AC
  Administered 2023-07-29: 1000 mg via INTRAVENOUS
  Filled 2023-07-29: qty 100

## 2023-07-29 MED ORDER — METHOCARBAMOL 500 MG PO TABS: 500.0000 mg | ORAL_TABLET | Freq: Four times a day (QID) | ORAL | Status: DC | PRN

## 2023-07-29 MED ORDER — MIDAZOLAM HCL 2 MG/2ML IJ SOLN
INTRAMUSCULAR | Status: AC
  Filled 2023-07-29: qty 2

## 2023-07-29 MED ORDER — SODIUM CHLORIDE 0.9 % IR SOLN
Status: DC | PRN
  Administered 2023-07-29: 250 mL
  Administered 2023-07-29: 1000 mL

## 2023-07-29 MED ORDER — DEXAMETHASONE SODIUM PHOSPHATE 10 MG/ML IJ SOLN
INTRAMUSCULAR | Status: AC
  Filled 2023-07-29: qty 1

## 2023-07-29 MED ORDER — SODIUM CHLORIDE (PF) 0.9 % IJ SOLN
INTRAMUSCULAR | Status: DC | PRN
  Administered 2023-07-29: 61 mL

## 2023-07-29 MED ORDER — ISOPROPYL ALCOHOL 70 % SOLN
Status: DC | PRN
  Administered 2023-07-29: 1 via TOPICAL

## 2023-07-29 MED ORDER — PRONTOSAN WOUND IRRIGATION OPTIME
TOPICAL | Status: DC | PRN
  Administered 2023-07-29: 1 via TOPICAL

## 2023-07-29 MED ORDER — LIDOCAINE HCL (PF) 2 % IJ SOLN
INTRAMUSCULAR | Status: AC
  Filled 2023-07-29: qty 5

## 2023-07-29 MED ORDER — OXYCODONE HCL 5 MG PO TABS
5.0000 mg | ORAL_TABLET | ORAL | 0 refills | Status: DC | PRN
  Filled 2023-07-29: qty 42, 7d supply, fill #0

## 2023-07-29 MED ORDER — CHLORHEXIDINE GLUCONATE 0.12 % MT SOLN
15.0000 mL | Freq: Once | OROMUCOSAL | Status: AC
  Administered 2023-07-29: 15 mL via OROMUCOSAL

## 2023-07-29 MED ORDER — KETOROLAC TROMETHAMINE 15 MG/ML IJ SOLN
INTRAMUSCULAR | Status: AC
  Administered 2023-07-29: 7.5 mg via INTRAVENOUS
  Filled 2023-07-29: qty 1

## 2023-07-29 MED ORDER — FENTANYL CITRATE (PF) 100 MCG/2ML IJ SOLN
INTRAMUSCULAR | Status: DC | PRN
Start: 2023-07-29 — End: 2023-07-29
  Administered 2023-07-29 (×2): 50 ug via INTRAVENOUS

## 2023-07-29 MED ORDER — POVIDONE-IODINE 10 % EX SWAB: 2.0000 | Freq: Once | CUTANEOUS | Status: DC

## 2023-07-29 MED ORDER — POLYETHYLENE GLYCOL 3350 17 GM/SCOOP PO POWD
17.0000 g | Freq: Every day | ORAL | 0 refills | Status: AC | PRN
  Filled 2023-07-29: qty 238, 14d supply, fill #0

## 2023-07-29 MED ORDER — SODIUM CHLORIDE (PF) 0.9 % IJ SOLN
INTRAMUSCULAR | Status: AC
  Filled 2023-07-29: qty 30

## 2023-07-29 MED ORDER — PHENYLEPHRINE 80 MCG/ML (10ML) SYRINGE FOR IV PUSH (FOR BLOOD PRESSURE SUPPORT)
PREFILLED_SYRINGE | INTRAVENOUS | Status: DC | PRN
  Administered 2023-07-29 (×2): 160 ug via INTRAVENOUS

## 2023-07-29 MED ORDER — KETOROLAC TROMETHAMINE 15 MG/ML IJ SOLN: 7.5000 mg | Freq: Four times a day (QID) | INTRAMUSCULAR | Status: DC

## 2023-07-29 MED ORDER — METHOCARBAMOL 500 MG PO TABS
ORAL_TABLET | ORAL | Status: AC
  Administered 2023-07-29: 500 mg via ORAL
  Filled 2023-07-29: qty 1

## 2023-07-29 MED ORDER — PHENYLEPHRINE HCL-NACL 20-0.9 MG/250ML-% IV SOLN
INTRAVENOUS | Status: DC | PRN
  Administered 2023-07-29: 40 ug/min via INTRAVENOUS

## 2023-07-29 MED ORDER — BUPIVACAINE-EPINEPHRINE (PF) 0.25% -1:200000 IJ SOLN
INTRAMUSCULAR | Status: AC
  Filled 2023-07-29: qty 30

## 2023-07-29 MED ORDER — ONDANSETRON HCL 4 MG PO TABS
4.0000 mg | ORAL_TABLET | Freq: Three times a day (TID) | ORAL | 0 refills | Status: DC | PRN
  Filled 2023-07-29: qty 30, 10d supply, fill #0

## 2023-07-29 SURGICAL SUPPLY — 63 items
BAG COUNTER SPONGE SURGICOUNT (BAG) IMPLANT
BAG ZIPLOCK 12X15 (MISCELLANEOUS) IMPLANT
BATTERY INSTRU NAVIGATION (MISCELLANEOUS) ×3 IMPLANT
BLADE SAW RECIPROCATING 77.5 (BLADE) ×1 IMPLANT
BNDG ELASTIC 4INX 5YD STR LF (GAUZE/BANDAGES/DRESSINGS) ×1 IMPLANT
BNDG ELASTIC 6INX 5YD STR LF (GAUZE/BANDAGES/DRESSINGS) ×1 IMPLANT
BOWL SMART MIX CTS (DISPOSABLE) IMPLANT
CEMENT BONE REFOBACIN R1X40 US (Cement) IMPLANT
CHLORAPREP W/TINT 26 (MISCELLANEOUS) ×2 IMPLANT
COMPONENT FEM CMT PERS SZ11LT (Joint) IMPLANT
COMPONENT PATELLA 3 PEG 41 (Joint) IMPLANT
COMPONENT TIB KNEE H 0D LT (Joint) IMPLANT
COVER BACK TABLE 60X90IN (DRAPES) IMPLANT
COVER SURGICAL LIGHT HANDLE (MISCELLANEOUS) ×1 IMPLANT
DERMABOND ADVANCED .7 DNX12 (GAUZE/BANDAGES/DRESSINGS) ×2 IMPLANT
DRAPE SHEET LG 3/4 BI-LAMINATE (DRAPES) ×3 IMPLANT
DRAPE U-SHAPE 47X51 STRL (DRAPES) ×1 IMPLANT
DRSG AQUACEL AG ADV 3.5X10 (GAUZE/BANDAGES/DRESSINGS) ×1 IMPLANT
DRSG AQUACEL AG ADV 3.5X14 (GAUZE/BANDAGES/DRESSINGS) IMPLANT
ELECT BLADE TIP CTD 4 INCH (ELECTRODE) ×1 IMPLANT
ELECT PENCIL ROCKER SW 15FT (MISCELLANEOUS) ×1 IMPLANT
ELECT REM PT RETURN 15FT ADLT (MISCELLANEOUS) ×1 IMPLANT
GAUZE SPONGE 4X4 12PLY STRL (GAUZE/BANDAGES/DRESSINGS) ×1 IMPLANT
GLOVE BIO SURGEON STRL SZ7 (GLOVE) ×1 IMPLANT
GLOVE BIO SURGEON STRL SZ8.5 (GLOVE) ×2 IMPLANT
GLOVE BIOGEL PI IND STRL 7.5 (GLOVE) ×1 IMPLANT
GLOVE BIOGEL PI IND STRL 8.5 (GLOVE) ×1 IMPLANT
GOWN SPEC L3 XXLG W/TWL (GOWN DISPOSABLE) ×1 IMPLANT
GOWN STRL REUS W/ TWL XL LVL3 (GOWN DISPOSABLE) ×1 IMPLANT
HOLDER FOLEY CATH W/STRAP (MISCELLANEOUS) ×1 IMPLANT
HOOD PEEL AWAY T7 (MISCELLANEOUS) ×3 IMPLANT
KIT TURNOVER KIT A (KITS) IMPLANT
LINER TIB PS GH/7-12 11 LT (Liner) IMPLANT
MARKER SKIN DUAL TIP RULER LAB (MISCELLANEOUS) ×1 IMPLANT
NDL SAFETY ECLIPSE 18X1.5 (NEEDLE) ×1 IMPLANT
NDL SPNL 18GX3.5 QUINCKE PK (NEEDLE) ×1 IMPLANT
NEEDLE SPNL 18GX3.5 QUINCKE PK (NEEDLE) ×1 IMPLANT
NS IRRIG 1000ML POUR BTL (IV SOLUTION) ×1 IMPLANT
PACK TOTAL KNEE CUSTOM (KITS) ×1 IMPLANT
PADDING CAST COTTON 6X4 STRL (CAST SUPPLIES) ×1 IMPLANT
PIN DRILL HDLS TROCAR 75 4PK (PIN) IMPLANT
PROTECTOR NERVE ULNAR (MISCELLANEOUS) ×1 IMPLANT
SAW OSC TIP CART 19.5X105X1.3 (SAW) ×1 IMPLANT
SCREW FEMALE HEX FIX 25X2.5 (ORTHOPEDIC DISPOSABLE SUPPLIES) IMPLANT
SEALER BIPOLAR AQUA 6.0 (INSTRUMENTS) ×1 IMPLANT
SET HNDPC FAN SPRY TIP SCT (DISPOSABLE) ×1 IMPLANT
SET PAD KNEE POSITIONER (MISCELLANEOUS) ×1 IMPLANT
SOLUTION PRONTOSAN WOUND 350ML (IRRIGATION / IRRIGATOR) ×1 IMPLANT
SPIKE FLUID TRANSFER (MISCELLANEOUS) ×2 IMPLANT
SPONGE T-LAP 18X18 ~~LOC~~+RFID (SPONGE) IMPLANT
SUT MNCRL AB 3-0 PS2 18 (SUTURE) ×1 IMPLANT
SUT MON AB 2-0 CT1 36 (SUTURE) ×1 IMPLANT
SUT STRATAFIX 14 PDO 48 VLT (SUTURE) ×1 IMPLANT
SUT STRATAFIX PDO 1 14 VIOLET (SUTURE) ×1 IMPLANT
SUT VIC AB 1 CTX36XBRD ANBCTR (SUTURE) ×2 IMPLANT
SUT VIC AB 2-0 CT1 TAPERPNT 27 (SUTURE) ×1 IMPLANT
SYR 3ML LL SCALE MARK (SYRINGE) ×1 IMPLANT
SYR BULB IRRIG 60ML STRL (SYRINGE) IMPLANT
TOWEL GREEN STERILE FF (TOWEL DISPOSABLE) ×1 IMPLANT
TRAY FOLEY MTR SLVR 16FR STAT (SET/KITS/TRAYS/PACK) IMPLANT
TUBE SUCTION HIGH CAP CLEAR NV (SUCTIONS) ×1 IMPLANT
WATER STERILE IRR 1000ML POUR (IV SOLUTION) ×2 IMPLANT
WRAP KNEE MAXI GEL POST OP (GAUZE/BANDAGES/DRESSINGS) IMPLANT

## 2023-07-29 NOTE — Anesthesia Postprocedure Evaluation (Signed)
 Anesthesia Post Note  Patient: Robert Knapp  Procedure(s) Performed: ARTHROPLASTY, KNEE, TOTAL, USING IMAGELESS COMPUTER-ASSISTED NAVIGATION (Left: Knee)     Patient location during evaluation: PACU Anesthesia Type: MAC and Spinal Level of consciousness: oriented and awake and alert Pain management: pain level controlled Vital Signs Assessment: post-procedure vital signs reviewed and stable Respiratory status: spontaneous breathing, respiratory function stable and patient connected to nasal cannula oxygen Cardiovascular status: blood pressure returned to baseline and stable Postop Assessment: no headache, no backache and no apparent nausea or vomiting Anesthetic complications: no   No notable events documented.  Last Vitals:  Vitals:   07/29/23 1245 07/29/23 1300  BP: 99/62 (!) 101/59  Pulse: (!) 57   Resp: 13   Temp:    SpO2: 94%     Last Pain:  Vitals:   07/29/23 1245  TempSrc:   PainSc: Asleep                 Konor Noren S

## 2023-07-29 NOTE — Anesthesia Procedure Notes (Signed)
 Anesthesia Regional Block: Adductor canal block   Pre-Anesthetic Checklist: , timeout performed,  Correct Patient, Correct Site, Correct Laterality,  Correct Procedure, Correct Position, site marked,  Risks and benefits discussed,  Surgical consent,  Pre-op evaluation,  At surgeon's request and post-op pain management  Laterality: Left  Prep: chloraprep       Needles:  Injection technique: Single-shot  Needle Type: Echogenic Needle     Needle Length: 9cm  Needle Gauge: 21     Additional Needles:   Narrative:  Start time: 07/29/2023 8:00 AM End time: 07/29/2023 8:07 AM Injection made incrementally with aspirations every 5 mL.  Performed by: Personally  Anesthesiologist: Ellena Gurney, MD  Additional Notes: Pt tolerated the procedure well.

## 2023-07-29 NOTE — H&P (View-Only) (Signed)
 TOTAL KNEE ADMISSION H&P  Patient is being admitted for left total knee arthroplasty.  Subjective:  Chief Complaint:left knee pain.  HPI: Robert Knapp, 68 y.o. male, has a history of pain and functional disability in the left knee due to arthritis and has failed non-surgical conservative treatments for greater than 12 weeks to includeNSAID's and/or analgesics, corticosteriod injections, flexibility and strengthening excercises, use of assistive devices, and activity modification.  Onset of symptoms was gradual, starting 10 years ago with rapidlly worsening course since that time. The patient noted no past surgery on the left knee(s).  Patient currently rates pain in the left knee(s) at 10 out of 10 with activity. Patient has night pain, worsening of pain with activity and weight bearing, pain that interferes with activities of daily living, pain with passive range of motion, crepitus, and joint swelling.  Patient has evidence of subchondral cysts, subchondral sclerosis, periarticular osteophytes, and joint space narrowing by imaging studies. There is no active infection.  Patient Active Problem List   Diagnosis Date Noted   Osteoarthritis of right knee 07/24/2022   Past Medical History:  Diagnosis Date   Allergy    Arthritis     Past Surgical History:  Procedure Laterality Date   COLONOSCOPY  04/14/2016   KNEE ARTHROPLASTY Right 07/24/2022   Procedure: COMPUTER ASSISTED TOTAL KNEE ARTHROPLASTY;  Surgeon: Adonica Hoose, MD;  Location: [facility];  Service: Orthopedics;  Laterality: Right;  160   NO PAST SURGERIES     POLYPECTOMY      No current facility-administered medications for this visit.   No current outpatient medications on file.   Facility-Administered Medications Ordered in Other Visits  Medication Dose Route Frequency Provider Last Rate Last Admin   ceFAZolin  (ANCEF ) IVPB 3g/150 mL premix  3 g Intravenous On Call to OR [place], Philippa Bray, PA-C       lactated ringers  infusion    Intravenous Continuous Harman Lightning, PA-C       lactated ringers  infusion   Intravenous Continuous Leslye Rast, MD 10 mL/hr at 07/29/23 0703 New Bag at 07/29/23 0703   povidone-iodine  10 % swab 2 Application  2 Application Topical Once Elinore Shults S, PA-C       tranexamic acid  (CYKLOKAPRON ) IVPB 1,000 mg  1,000 mg Intravenous To OR Aianna Fahs S, PA-C       No Known Allergies  Social History   Tobacco Use   Smoking status: Never   Smokeless tobacco: Never  Substance Use Topics   Alcohol use: Not Currently    Family History  Problem Relation Age of Onset   Heart disease Mother    Colon cancer Mother 15   Colon polyps Neg Hx    Esophageal cancer Neg Hx    Rectal cancer Neg Hx    Stomach cancer Neg Hx    Pancreatic cancer Neg Hx      Review of Systems  Musculoskeletal:  Positive for arthralgias, gait problem and joint swelling.  All other systems reviewed and are negative.   Objective:  Physical Exam Constitutional:      Appearance: Normal appearance.  HENT:     Head: Normocephalic and atraumatic.     Nose: Nose normal.     Mouth/Throat:     Mouth: Mucous membranes are moist.     Pharynx: Oropharynx is clear.  Eyes:     Conjunctiva/sclera: Conjunctivae normal.  Cardiovascular:     Rate and Rhythm: Normal rate and regular rhythm.  Pulses: Normal pulses.     Heart sounds: Normal heart sounds.  Pulmonary:     Effort: Pulmonary effort is normal.     Breath sounds: Normal breath sounds.  Abdominal:     General: Abdomen is flat.     Palpations: Abdomen is soft.  Genitourinary:    Comments: Deferred.  Musculoskeletal:     Cervical back: Normal range of motion and neck supple.     Comments: Examination of the left knee reveals no skin wounds or lesions. Mild varus deformity. He has some swelling. No warmth, erythema, or effusion. Tenderness to palpation medial joint line, lateral joint line, peripatellar retinacular tissues with a positive grind sign. Range of  motion 10 to 105 degrees without any ligamentous instability. Painless range of motion of the hip.  Distally, there is no focal motor or sensory deficit. He has palpable pedal pulses.  Pitting pedal edema in LE bilaterally.   He ambulates with an antalgic gait.  Skin:    General: Skin is warm and dry.     Capillary Refill: Capillary refill takes less than 2 seconds.  Neurological:     General: No focal deficit present.     Mental Status: He is alert and oriented to person, place, and time.  Psychiatric:        Mood and Affect: Mood normal.        Behavior: Behavior normal.        Thought Content: Thought content normal.        Judgment: Judgment normal.     Vital signs in last 24 hours: @VSRANGES @  Labs:   Estimated body mass index is 37.62 kg/m as calculated from the following:   Height as of 07/24/23: 6\' 2"  (1.88 m).   Weight as of an earlier encounter on 07/29/23: 132.9 kg.   Imaging Review Plain radiographs demonstrate severe degenerative joint disease of the left knee(s). The overall alignment ismild varus. The bone quality appears to be adequate for age and reported activity level.      Assessment/Plan:  End stage arthritis, left knee   The patient history, physical examination, clinical judgment of the provider and imaging studies are consistent with end stage degenerative joint disease of the left knee(s) and total knee arthroplasty is deemed medically necessary. The treatment options including medical management, injection therapy arthroscopy and arthroplasty were discussed at length. The risks and benefits of total knee arthroplasty were presented and reviewed. The risks due to aseptic loosening, infection, stiffness, patella tracking problems, thromboembolic complications and other imponderables were discussed. The patient acknowledged the explanation, agreed to proceed with the plan and consent was signed. Patient is being admitted for inpatient treatment for  surgery, pain control, PT, OT, prophylactic antibiotics, VTE prophylaxis, progressive ambulation and ADL's and discharge planning. The patient is planning to be discharged home with OPPT.   Therapy Plans: outpatient therapy. PT at [facility] 07/31/23 1st appointment.  Disposition: Home with wife Planned DVT Prophylaxis: aspirin  81mg  BID DME needed: Has walker and ice machine.  PCP: Cleared TXA: IV Allergies: NDKA.  Anesthesia Concerns: None.  BMI: 38.9 Last HgbA1c: 5.2 Other: - Right TKA 07/24/22.  - Pitting edema in LE bilaterally at baseline  - Oxycodone , zofran , methocarbamol , has celebrex .  [facility].  - 07/24/23: Hgb 14.4, K+ 4.2, Cr. 0.92.     Patient's anticipated LOS is less than 2 midnights, meeting these requirements: - Younger than 63 - Lives within 1 hour of care - Has a competent  adult at home to recover with post-op recover - NO history of  - Chronic pain requiring opiods  - Diabetes  - Coronary Artery Disease  - Heart failure  - Heart attack  - Stroke  - DVT/VTE  - Cardiac arrhythmia  - Respiratory Failure/COPD  - Renal failure  - Anemia  - Advanced Liver disease

## 2023-07-29 NOTE — Transfer of Care (Signed)
 Immediate Anesthesia Transfer of Care Note  Patient: Robert Knapp  Procedure(s) Performed: ARTHROPLASTY, KNEE, TOTAL, USING IMAGELESS COMPUTER-ASSISTED NAVIGATION (Left: Knee)  Patient Location: PACU  Anesthesia Type:MAC and Spinal  Level of Consciousness: drowsy  Airway & Oxygen Therapy: Patient Spontanous Breathing and Patient connected to face mask oxygen  Post-op Assessment: Report given to RN and Post -op Vital signs reviewed and stable  Post vital signs: Reviewed and stable  Last Vitals:  Vitals Value Taken Time  BP 101/59 07/29/23 1235  Temp    Pulse 58 07/29/23 1239  Resp 13 07/29/23 1239  SpO2 94 % 07/29/23 1239  Vitals shown include unfiled device data.  Last Pain:  Vitals:   07/29/23 0657  TempSrc: Oral  PainSc:          Complications: No notable events documented.

## 2023-07-29 NOTE — Discharge Instructions (Signed)

## 2023-07-29 NOTE — Anesthesia Procedure Notes (Signed)
 Spinal  Patient location during procedure: OR Start time: 07/29/2023 9:21 AM End time: 07/29/2023 9:23 AM Reason for block: surgical anesthesia Staffing Performed: anesthesiologist  Anesthesiologist: Ellena Gurney, MD Performed by: Ellena Gurney, MD Authorized by: Ellena Gurney, MD   Preanesthetic Checklist Completed: patient identified, IV checked, risks and benefits discussed, surgical consent, monitors and equipment checked, pre-op evaluation and timeout performed Spinal Block Patient position: sitting Prep: DuraPrep Patient monitoring: cardiac monitor, continuous pulse ox and blood pressure Approach: midline Location: L3-4 Injection technique: single-shot Needle Needle type: Pencan  Needle gauge: 24 G Needle length: 9 cm Assessment Sensory level: T10 Events: CSF return Additional Notes Functioning IV was confirmed and monitors were applied. Sterile prep and drape, including hand hygiene and sterile gloves were used. The patient was positioned and the spine was prepped. The skin was anesthetized with lidocaine.  Free flow of clear CSF was obtained prior to injecting local anesthetic into the CSF.  The spinal needle aspirated freely following injection.  The needle was carefully withdrawn.  The patient tolerated the procedure well.

## 2023-07-29 NOTE — Anesthesia Preprocedure Evaluation (Signed)
 Anesthesia Evaluation  Patient identified by MRN, date of birth, ID band Patient awake    Reviewed: Allergy & Precautions, H&P , NPO status , Patient's Chart, lab work & pertinent test results  Airway Mallampati: II   Neck ROM: full    Dental   Pulmonary neg pulmonary ROS   breath sounds clear to auscultation       Cardiovascular negative cardio ROS  Rhythm:regular Rate:Normal     Neuro/Psych    GI/Hepatic ,GERD  ,,  Endo/Other    Class 3 obesity  Renal/GU      Musculoskeletal  (+) Arthritis ,    Abdominal   Peds  Hematology   Anesthesia Other Findings   Reproductive/Obstetrics                             Anesthesia Physical Anesthesia Plan  ASA: 2  Anesthesia Plan: MAC and Spinal   Post-op Pain Management: Regional block*   Induction: Intravenous  PONV Risk Score and Plan: 1 and Propofol  infusion and Treatment may vary due to age or medical condition  Airway Management Planned: Simple Face Mask  Additional Equipment:   Intra-op Plan:   Post-operative Plan:   Informed Consent: I have reviewed the patients History and Physical, chart, labs and discussed the procedure including the risks, benefits and alternatives for the proposed anesthesia with the patient or authorized representative who has indicated his/her understanding and acceptance.     Dental advisory given  Plan Discussed with: CRNA, Anesthesiologist and Surgeon  Anesthesia Plan Comments:        Anesthesia Quick Evaluation

## 2023-07-29 NOTE — Evaluation (Signed)
 Physical Therapy Evaluation Patient Details Name: Robert Knapp MRN: [id number] DOB: Jul 25, 1955 Today's Date: 07/29/2023  History of Present Illness  Pt is 68 yo male s/p L TKA on 07/29/23.  Pt with hx including but not limited to arthritis, R TKA 2024  Clinical Impression  Pt is s/p TKA resulting in the deficits listed below (see PT Problem List). PT with orders to assess pt for possible SDDC.  Pt with good pain control, ROM, and quad activation.  He was able to ambulate 40' with RW and performed stairs but did have some instability with gait and stairs and needing increased time for cues at times.  Pt's wife present and feels that pt looks weak and pale compared to baseline and after R TKA.  PT agrees.  VS were stable.  Pt would benefit from further therapy prior to d/c.  This session was approximately 2 hr after sx, plan to f/u later this afternoon - expect that pt will likely be able to progress with more time.  Pt will benefit from acute skilled PT to increase their independence and safety with mobility to allow discharge.          If plan is discharge home, recommend the following: A little help with walking and/or transfers;A little help with bathing/dressing/bathroom;Assistance with cooking/housework;Help with stairs or ramp for entrance   Can travel by private vehicle        Equipment Recommendations None recommended by PT  Recommendations for Other Services       Functional Status Assessment Patient has had a recent decline in their functional status and demonstrates the ability to make significant improvements in function in a reasonable and predictable amount of time.     Precautions / Restrictions Precautions Precautions: Fall;Knee Restrictions Weight Bearing Restrictions Per Provider Order: Yes LLE Weight Bearing Per Provider Order: Weight bearing as tolerated      Mobility  Bed Mobility Overal bed mobility: Needs Assistance Bed Mobility: Supine to Sit     Supine to  sit: Contact guard          Transfers Overall transfer level: Needs assistance Equipment used: Rolling walker (2 wheels) Transfers: Sit to/from Stand Sit to Stand: Contact guard assist           General transfer comment: CGA for safety; cues for hand placement    Ambulation/Gait Ambulation/Gait assistance: Contact guard assist Gait Distance (Feet): 80 Feet Assistive device: Rolling walker (2 wheels) Gait Pattern/deviations: Step-through pattern, Step-to pattern, Decreased stride length, Decreased weight shift to left, Trunk flexed Gait velocity: decreased     General Gait Details: Overall CGA but did have multiple episodes of instability needing cues and time to correct.  Needing increased time for cueing on correct sequencing and step - to pattern for safety, would revert to step through at times.  Wife agreeing that pt just looks unstable and a little weak at this time.  All VSS.  Stairs Stairs: Yes Stairs assistance: Contact guard assist Stair Management: Step to pattern Number of Stairs: 4 General stair comments: Started wtih bil rails forward and CGA tolerating well; progressed to sideways rail on L but increased cues, some instability but still only needed CGA  Wheelchair Mobility     Tilt Bed    Modified Rankin (Stroke Patients Only)       Balance Overall balance assessment: Needs assistance Sitting-balance support: No upper extremity supported Sitting balance-Leahy Scale: Good     Standing balance support: Bilateral upper extremity supported, Reliant on  assistive device for balance Standing balance-Leahy Scale: Poor                               Pertinent Vitals/Pain Pain Assessment Pain Assessment: 0-10 Pain Score: 1  Pain Location: L knee Pain Descriptors / Indicators: Sore Pain Intervention(s): Limited activity within patient's tolerance, Premedicated before session, Monitored during session, Repositioned    Home Living  Family/patient expects to be discharged to:: Private residence Living Arrangements: Spouse/significant other Available Help at Discharge: Family;Available 24 hours/day Type of Home: House Home Access: Stairs to enter Entrance Stairs-Rails: Doctor, general practice of Steps: 5   Home Layout: One level Home Equipment: Agricultural consultant (2 wheels);Cane - single point      Prior Function Prior Level of Function : Independent/Modified Independent;Working/employed;Driving                     Extremity/Trunk Assessment   Upper Extremity Assessment Upper Extremity Assessment: Overall WFL for tasks assessed    Lower Extremity Assessment Lower Extremity Assessment: LLE deficits/detail;RLE deficits/detail RLE Deficits / Details: ROM WFL; MMT 5/5 LLE Deficits / Details: Expected post op changes; ROM knee 5 to 90 degrees; MMT: ankle 5/5, knee and hip 3/5 not further tested    Cervical / Trunk Assessment Cervical / Trunk Assessment: Normal  Communication        Cognition Arousal: Alert Behavior During Therapy: Flat affect   PT - Cognitive impairments: No apparent impairments                       PT - Cognition Comments: Reports feels like getting "his bearings"         Cueing       General Comments General comments (skin integrity, edema, etc.): Pt with flat affect and some instabilities at time.  Wife reports looks a little pale.  All VSS with BP increasing with activity.    Exercises     Assessment/Plan    PT Assessment Patient needs continued PT services  PT Problem List Decreased strength;Pain;Decreased range of motion;Decreased activity tolerance;Decreased balance;Decreased mobility;Decreased knowledge of use of DME       PT Treatment Interventions DME instruction;Therapeutic exercise;Gait training;Stair training;Functional mobility training;Therapeutic activities;Patient/family education;Modalities;Balance training    PT Goals (Current  goals can be found in the Care Plan section)  Acute Rehab PT Goals Patient Stated Goal: return home PT Goal Formulation: With patient/family Time For Goal Achievement: 08/12/23 Potential to Achieve Goals: Good    Frequency 7X/week     Co-evaluation               AM-PAC PT "6 Clicks" Mobility  Outcome Measure Help needed turning from your back to your side while in a flat bed without using bedrails?: A Little Help needed moving from lying on your back to sitting on the side of a flat bed without using bedrails?: A Little Help needed moving to and from a bed to a chair (including a wheelchair)?: A Little Help needed standing up from a chair using your arms (e.g., wheelchair or bedside chair)?: A Little Help needed to walk in hospital room?: A Little Help needed climbing 3-5 steps with a railing? : A Little 6 Click Score: 18    End of Session Equipment Utilized During Treatment: Gait belt Activity Tolerance: Patient tolerated treatment well Patient left: in chair;with family/visitor present (in PACU) Nurse Communication: Mobility status PT Visit Diagnosis: Other  abnormalities of gait and mobility (R26.89);Muscle weakness (generalized) (M62.81)    Time: 2536-6440 PT Time Calculation (min) (ACUTE ONLY): 23 min   Charges:   PT Evaluation $PT Eval Low Complexity: 1 Low PT Treatments $Gait Training: 8-22 mins PT General Charges $$ ACUTE PT VISIT: 1 Visit         Cyd Dowse, PT Acute Rehab [facility] [phone]   Carolynn Citrin 07/29/2023, 3:20 PM

## 2023-07-29 NOTE — Interval H&P Note (Signed)
 History and Physical Interval Note:  07/29/2023 8:21 AM  Robert Knapp  has presented today for surgery, with the diagnosis of Left knee osteoarthritis.  The various methods of treatment have been discussed with the patient and family. After consideration of risks, benefits and other options for treatment, the patient has consented to  Procedure(s): ARTHROPLASTY, KNEE, TOTAL, USING IMAGELESS COMPUTER-ASSISTED NAVIGATION (Left) as a surgical intervention.  The patient's history has been reviewed, patient examined, no change in status, stable for surgery.  I have reviewed the patient's chart and labs.  Questions were answered to the patient's satisfaction.     Margart Shears Lamesha Tibbits

## 2023-07-29 NOTE — Care Plan (Signed)
 Ortho Bundle Case Management Note  Patient Details  Name: Robert Knapp MRN: [id number] Date of Birth: Dec 11, 1955  L TKA on 07/29/23.   DCP: Home with wife Robert Knapp.  DME: No needs. Has RW and cane.  PT: Calvert Caul                   DME Arranged:  N/A DME Agency:  NA  HH Arranged:    HH Agency:     Additional Comments: Please contact me with any questions of if this plan should need to change.  Bronwen Canon, [place]  [facility] [phone]   07/29/2023, 7:55 AM

## 2023-07-29 NOTE — H&P (Signed)
 TOTAL KNEE ADMISSION H&P  Patient is being admitted for left total knee arthroplasty.  Subjective:  Chief Complaint:left knee pain.  HPI: Robert Knapp, 68 y.o. male, has a history of pain and functional disability in the left knee due to arthritis and has failed non-surgical conservative treatments for greater than 12 weeks to includeNSAID's and/or analgesics, corticosteriod injections, flexibility and strengthening excercises, use of assistive devices, and activity modification.  Onset of symptoms was gradual, starting 10 years ago with rapidlly worsening course since that time. The patient noted no past surgery on the left knee(s).  Patient currently rates pain in the left knee(s) at 10 out of 10 with activity. Patient has night pain, worsening of pain with activity and weight bearing, pain that interferes with activities of daily living, pain with passive range of motion, crepitus, and joint swelling.  Patient has evidence of subchondral cysts, subchondral sclerosis, periarticular osteophytes, and joint space narrowing by imaging studies. There is no active infection.  Patient Active Problem List   Diagnosis Date Noted   Osteoarthritis of right knee 07/24/2022   Past Medical History:  Diagnosis Date   Allergy    Arthritis     Past Surgical History:  Procedure Laterality Date   COLONOSCOPY  04/14/2016   KNEE ARTHROPLASTY Right 07/24/2022   Procedure: COMPUTER ASSISTED TOTAL KNEE ARTHROPLASTY;  Surgeon: Adonica Hoose, MD;  Location: [facility];  Service: Orthopedics;  Laterality: Right;  160   NO PAST SURGERIES     POLYPECTOMY      No current facility-administered medications for this visit.   No current outpatient medications on file.   Facility-Administered Medications Ordered in Other Visits  Medication Dose Route Frequency Provider Last Rate Last Admin   ceFAZolin  (ANCEF ) IVPB 3g/150 mL premix  3 g Intravenous On Call to OR [place], Philippa Bray, PA-C       lactated ringers  infusion    Intravenous Continuous Harman Lightning, PA-C       lactated ringers  infusion   Intravenous Continuous Leslye Rast, MD 10 mL/hr at 07/29/23 0703 New Bag at 07/29/23 0703   povidone-iodine  10 % swab 2 Application  2 Application Topical Once Elinore Shults S, PA-C       tranexamic acid  (CYKLOKAPRON ) IVPB 1,000 mg  1,000 mg Intravenous To OR Aianna Fahs S, PA-C       No Known Allergies  Social History   Tobacco Use   Smoking status: Never   Smokeless tobacco: Never  Substance Use Topics   Alcohol use: Not Currently    Family History  Problem Relation Age of Onset   Heart disease Mother    Colon cancer Mother 15   Colon polyps Neg Hx    Esophageal cancer Neg Hx    Rectal cancer Neg Hx    Stomach cancer Neg Hx    Pancreatic cancer Neg Hx      Review of Systems  Musculoskeletal:  Positive for arthralgias, gait problem and joint swelling.  All other systems reviewed and are negative.   Objective:  Physical Exam Constitutional:      Appearance: Normal appearance.  HENT:     Head: Normocephalic and atraumatic.     Nose: Nose normal.     Mouth/Throat:     Mouth: Mucous membranes are moist.     Pharynx: Oropharynx is clear.  Eyes:     Conjunctiva/sclera: Conjunctivae normal.  Cardiovascular:     Rate and Rhythm: Normal rate and regular rhythm.  Pulses: Normal pulses.     Heart sounds: Normal heart sounds.  Pulmonary:     Effort: Pulmonary effort is normal.     Breath sounds: Normal breath sounds.  Abdominal:     General: Abdomen is flat.     Palpations: Abdomen is soft.  Genitourinary:    Comments: Deferred.  Musculoskeletal:     Cervical back: Normal range of motion and neck supple.     Comments: Examination of the left knee reveals no skin wounds or lesions. Mild varus deformity. He has some swelling. No warmth, erythema, or effusion. Tenderness to palpation medial joint line, lateral joint line, peripatellar retinacular tissues with a positive grind sign. Range of  motion 10 to 105 degrees without any ligamentous instability. Painless range of motion of the hip.  Distally, there is no focal motor or sensory deficit. He has palpable pedal pulses.  Pitting pedal edema in LE bilaterally.   He ambulates with an antalgic gait.  Skin:    General: Skin is warm and dry.     Capillary Refill: Capillary refill takes less than 2 seconds.  Neurological:     General: No focal deficit present.     Mental Status: He is alert and oriented to person, place, and time.  Psychiatric:        Mood and Affect: Mood normal.        Behavior: Behavior normal.        Thought Content: Thought content normal.        Judgment: Judgment normal.     Vital signs in last 24 hours: @VSRANGES @  Labs:   Estimated body mass index is 37.62 kg/m as calculated from the following:   Height as of 07/24/23: 6\' 2"  (1.88 m).   Weight as of an earlier encounter on 07/29/23: 132.9 kg.   Imaging Review Plain radiographs demonstrate severe degenerative joint disease of the left knee(s). The overall alignment ismild varus. The bone quality appears to be adequate for age and reported activity level.      Assessment/Plan:  End stage arthritis, left knee   The patient history, physical examination, clinical judgment of the provider and imaging studies are consistent with end stage degenerative joint disease of the left knee(s) and total knee arthroplasty is deemed medically necessary. The treatment options including medical management, injection therapy arthroscopy and arthroplasty were discussed at length. The risks and benefits of total knee arthroplasty were presented and reviewed. The risks due to aseptic loosening, infection, stiffness, patella tracking problems, thromboembolic complications and other imponderables were discussed. The patient acknowledged the explanation, agreed to proceed with the plan and consent was signed. Patient is being admitted for inpatient treatment for  surgery, pain control, PT, OT, prophylactic antibiotics, VTE prophylaxis, progressive ambulation and ADL's and discharge planning. The patient is planning to be discharged home with OPPT.   Therapy Plans: outpatient therapy. PT at [facility] 07/31/23 1st appointment.  Disposition: Home with wife Planned DVT Prophylaxis: aspirin  81mg  BID DME needed: Has walker and ice machine.  PCP: Cleared TXA: IV Allergies: NDKA.  Anesthesia Concerns: None.  BMI: 38.9 Last HgbA1c: 5.2 Other: - Right TKA 07/24/22.  - Pitting edema in LE bilaterally at baseline  - Oxycodone , zofran , methocarbamol , has celebrex .  [facility].  - 07/24/23: Hgb 14.4, K+ 4.2, Cr. 0.92.     Patient's anticipated LOS is less than 2 midnights, meeting these requirements: - Younger than 63 - Lives within 1 hour of care - Has a competent  adult at home to recover with post-op recover - NO history of  - Chronic pain requiring opiods  - Diabetes  - Coronary Artery Disease  - Heart failure  - Heart attack  - Stroke  - DVT/VTE  - Cardiac arrhythmia  - Respiratory Failure/COPD  - Renal failure  - Anemia  - Advanced Liver disease

## 2023-07-29 NOTE — Progress Notes (Signed)
 Physical Therapy Treatment Patient Details Name: Robert Knapp MRN: [id number] DOB: [birth year] Today's Date: 07/29/2023   History of Present Illness Pt is 68 yo male s/p L TKA on 07/29/23.  Pt with hx including but not limited to arthritis, R TKA 2024    PT Comments  PT with f/u for second session in PACU.  Pt more alert and steady this session.  Ambulated 28' with RW and CGA, performed stairs with single rail to simulate home environment.  Pt with steady balance throughout.  Pt demonstrates safe gait & transfers in order to return home from PT perspective once discharged by MD.  While in hospital, will continue to benefit from PT for skilled therapy to advance mobility and exercises. Notified RN pt cleared PT but not able to urinate.     If plan is discharge home, recommend the following: A little help with walking and/or transfers;A little help with bathing/dressing/bathroom;Assistance with cooking/housework;Help with stairs or ramp for entrance   Can travel by private vehicle        Equipment Recommendations  None recommended by PT    Recommendations for Other Services       Precautions / Restrictions Precautions Precautions: Fall;Knee Restrictions Weight Bearing Restrictions Per Provider Order: Yes LLE Weight Bearing Per Provider Order: Weight bearing as tolerated     Mobility  Bed Mobility Overal bed mobility: Needs Assistance Bed Mobility: Supine to Sit     Supine to sit: Contact guard     General bed mobility comments: in chair    Transfers Overall transfer level: Needs assistance Equipment used: Rolling walker (2 wheels) Transfers: Sit to/from Stand Sit to Stand: Supervision           General transfer comment: Supervision for safety    Ambulation/Gait Ambulation/Gait assistance: Contact guard assist Gait Distance (Feet): 80 Feet Assistive device: Rolling walker (2 wheels) Gait Pattern/deviations: Step-through pattern Gait velocity: decreased      General Gait Details: More fluid and steady gait pattern ; some cues for posture and RW proximity with good carry over   Stairs Stairs: Yes Stairs assistance: Contact guard assist Stair Management: Step to pattern Number of Stairs: 4 General stair comments: Started wtih bil rails forward and CGA tolerating well; progressed to sideways rail on L tolerated well   Wheelchair Mobility     Tilt Bed    Modified Rankin (Stroke Patients Only)       Balance Overall balance assessment: Needs assistance Sitting-balance support: No upper extremity supported Sitting balance-Leahy Scale: Good     Standing balance support: Bilateral upper extremity supported, Reliant on assistive device for balance Standing balance-Leahy Scale: Poor Standing balance comment: steady with RW                            Communication    Cognition Arousal: Alert Behavior During Therapy: WFL for tasks assessed/performed   PT - Cognitive impairments: No apparent impairments                       PT - Cognition Comments: More alert this second session.        Cueing    Exercises Total Joint Exercises Ankle Circles/Pumps: AROM, Both, 10 reps, Supine Quad Sets: AROM, Left, 5 reps, Supine Heel Slides: AAROM, Left, 5 reps, Supine Hip ABduction/ADduction: AAROM, Left, 5 reps, Supine Long Arc Quad: AROM, Left, 5 reps, Seated Knee Flexion: AROM, Left, 5 reps, Seated  General Comments General comments (skin integrity, edema, etc.): Pt more alert. Wife reports more back to normal and color is better.  All VSS.  Educated on safe ice use, no pivots, car transfers, resting with leg straight, and TED hose during day. Also, encouraged walking every 1-2 hours during day. Educated on HEP with focus on mobility the first weeks. Discussed doing exercises within pain control and if pain increasing could decreased ROM, reps, and stop exercises as needed. Encouraged to perform quad sets and ankle  pumps frequently for blood flow and to promote full knee extension.      Pertinent Vitals/Pain Pain Assessment Pain Assessment: 0-10 Pain Score: 2  Pain Location: L knee Pain Descriptors / Indicators: Sore Pain Intervention(s): Limited activity within patient's tolerance, Monitored during session, Premedicated before session, Repositioned    Home Living Family/patient expects to be discharged to:: Private residence Living Arrangements: Spouse/significant other Available Help at Discharge: Family;Available 24 hours/day Type of Home: House Home Access: Stairs to enter Entrance Stairs-Rails: Doctor, general practice of Steps: 5   Home Layout: One level Home Equipment: Agricultural consultant (2 wheels);Cane - single point      Prior Function            PT Goals (current goals can now be found in the care plan section) Acute Rehab PT Goals Patient Stated Goal: return home PT Goal Formulation: With patient/family Time For Goal Achievement: 08/12/23 Potential to Achieve Goals: Good Progress towards PT goals: Progressing toward goals    Frequency    7X/week      PT Plan      Co-evaluation              AM-PAC PT "6 Clicks" Mobility   Outcome Measure  Help needed turning from your back to your side while in a flat bed without using bedrails?: A Little Help needed moving from lying on your back to sitting on the side of a flat bed without using bedrails?: A Little Help needed moving to and from a bed to a chair (including a wheelchair)?: A Little Help needed standing up from a chair using your arms (e.g., wheelchair or bedside chair)?: A Little Help needed to walk in hospital room?: A Little Help needed climbing 3-5 steps with a railing? : A Little 6 Click Score: 18    End of Session Equipment Utilized During Treatment: Gait belt Activity Tolerance: Patient tolerated treatment well Patient left: in chair;with family/visitor present (in PACU) Nurse  Communication: Mobility status PT Visit Diagnosis: Other abnormalities of gait and mobility (R26.89);Muscle weakness (generalized) (M62.81)     Time: 1610-9604 PT Time Calculation (min) (ACUTE ONLY): 21 min  Charges:    $Therapeutic Exercise: 8-22 mins PT General Charges $$ ACUTE PT VISIT: 1 Visit                     Cyd Dowse, PT Acute Rehab [facility] Rehab [phone]    Carolynn Citrin 07/29/2023, 5:12 PM

## 2023-07-29 NOTE — Op Note (Signed)
 OPERATIVE REPORT  SURGEON: Adonica Hoose, MD   ASSISTANT: Trixie Furnace, PA-C  PREOPERATIVE DIAGNOSIS: Primary Left knee arthritis.   POSTOPERATIVE DIAGNOSIS: Primary Left knee arthritis.   PROCEDURE: Computer assisted hybrid fixation Left total knee arthroplasty.   IMPLANTS: Zimmer Persona Cemented CR femur, size 11. Persona 0 degree Spiked Keel OsseoTi Tibia, size H. Vivacit-E polyethelyene insert, size 11 mm, CR. OsseoTi 3-Peg patella, size 41 mm. Refobacin bone cement.  ANESTHESIA:  MAC, Regional, and Spinal  TOURNIQUET TIME: Not utilized.   ESTIMATED BLOOD LOSS:-600 mL    ANTIBIOTICS: 2g Ancef .  DRAINS: None.  COMPLICATIONS: None   CONDITION: PACU - hemodynamically stable.   BRIEF CLINICAL NOTE: Robert Knapp is a 68 y.o. male with a long-standing history of Left knee arthritis. After failing conservative management, the patient was indicated for total knee arthroplasty. The risks, benefits, and alternatives to the procedure were explained, and the patient elected to proceed.  PROCEDURE IN DETAIL: Adductor canal block was obtained in the pre-op holding area. Once inside the operative room, spinal anesthesia was obtained, and a foley catheter was inserted. The patient was then positioned and the lower extremity was prepped and draped in the normal sterile surgical fashion.  A time-out was called verifying side and site of surgery. The patient received IV antibiotics within 60 minutes of beginning the procedure. A tourniquet was not utilized.   An anterior approach to the knee was performed utilizing a midvastus arthrotomy. A medial release was performed and the patellar fat pad was excised. Stryker imageless navigation was used to cut the distal femur perpendicular to the mechanical axis. A freehand patellar resection was performed, and the patella was sized an prepared with 3 lug holes.  Nagivation was used to make a neutral proximal tibia resection, taking 9 mm of bone from  the less affected lateral side with 3 degrees of slope. The menisci were excised. A spacer block was placed, and the alignment and balance in extension were confirmed.   The distal femur was sized using the 3-degree external rotation guide referencing the posterior femoral cortex. The appropriate 4-in-1 cutting block was pinned into place. Rotation was checked using Whiteside's line, the epicondylar axis, and then confirmed with a spacer block in flexion. The remaining femoral cuts were performed, taking care to protect the MCL.  The tibia was sized and the trial tray was pinned into place. The remaining trail components were inserted. The knee was stable to varus and valgus stress through a full range of motion. The patella tracked centrally, and the PCL was well balanced. The trial components were removed, and the proximal tibial surface was prepared. Final tibial component was impacted into place. Trial poly liner was inserted.  The distal femur bony surface was irrigated and dried.  The femoral component was cemented into place, and the knee was brought into extension while the cement polymerized.  Excess cement was cleared. The trial liner was exchanged for the real liner.  The patellar component was inserted.  The knee was tested for a final time and found to be well balanced.   The wound was copiously irrigated with Prontosan solution and normal saline using pulse lavage.  Marcaine  solution was injected into the periarticular soft tissue.  The wound was closed in layers using #1 Vicryl and Stratafix for the fascia, 2-0 Vicryl for the subcutaneous fat, 2-0 Monocryl for the deep dermal layer, 3-0 running Monocryl subcuticular Stitch, and 4-0 Monocryl stay sutures at both ends of the wound. Dermabond  was applied to the skin.  Once the glue was fully dried, an Aquacell Ag and compressive dressing were applied.  The patient was transported to the recovery room in stable condition.  Sponge, needle, and  instrument counts were correct at the end of the case x2.  The patient tolerated the procedure well and there were no known complications.  The aquamantis was utilized for this case to help facilitate better hemostasis as patient was felt to be at increased risk of bleeding because of complex case requiring increased OR time and/or exposure.  -minimally invasive approach.  A oscillating saw tip was utilized for this case to prevent damage to the soft tissue structures such as muscles, ligaments and tendons, and to ensure accurate bone cuts. This patient was at increased risk for above structures due to  minimally invasive approach.  Please note that a surgical assistant was a medical necessity for this procedure in order to perform it in a safe and expeditious manner. Surgical assistant was necessary to retract the ligaments and vital neurovascular structures to prevent injury to them and also necessary for proper positioning of the limb to allow for anatomic placement of the prosthesis.

## 2023-07-30 ENCOUNTER — Encounter (HOSPITAL_COMMUNITY): Payer: Self-pay | Admitting: Orthopedic Surgery

## 2023-07-31 DIAGNOSIS — M25662 Stiffness of left knee, not elsewhere classified: Secondary | ICD-10-CM | POA: Diagnosis not present

## 2023-07-31 DIAGNOSIS — R269 Unspecified abnormalities of gait and mobility: Secondary | ICD-10-CM | POA: Diagnosis not present

## 2023-07-31 DIAGNOSIS — M25562 Pain in left knee: Secondary | ICD-10-CM | POA: Diagnosis not present

## 2023-07-31 DIAGNOSIS — R29898 Other symptoms and signs involving the musculoskeletal system: Secondary | ICD-10-CM | POA: Diagnosis not present

## 2023-08-03 DIAGNOSIS — R29898 Other symptoms and signs involving the musculoskeletal system: Secondary | ICD-10-CM | POA: Diagnosis not present

## 2023-08-03 DIAGNOSIS — R269 Unspecified abnormalities of gait and mobility: Secondary | ICD-10-CM | POA: Diagnosis not present

## 2023-08-03 DIAGNOSIS — M25562 Pain in left knee: Secondary | ICD-10-CM | POA: Diagnosis not present

## 2023-08-03 DIAGNOSIS — M25662 Stiffness of left knee, not elsewhere classified: Secondary | ICD-10-CM | POA: Diagnosis not present

## 2023-08-05 DIAGNOSIS — M25562 Pain in left knee: Secondary | ICD-10-CM | POA: Diagnosis not present

## 2023-08-05 DIAGNOSIS — R29898 Other symptoms and signs involving the musculoskeletal system: Secondary | ICD-10-CM | POA: Diagnosis not present

## 2023-08-05 DIAGNOSIS — M25662 Stiffness of left knee, not elsewhere classified: Secondary | ICD-10-CM | POA: Diagnosis not present

## 2023-08-05 DIAGNOSIS — R269 Unspecified abnormalities of gait and mobility: Secondary | ICD-10-CM | POA: Diagnosis not present

## 2023-08-07 ENCOUNTER — Other Ambulatory Visit (HOSPITAL_COMMUNITY): Payer: Self-pay

## 2023-08-07 DIAGNOSIS — R269 Unspecified abnormalities of gait and mobility: Secondary | ICD-10-CM | POA: Diagnosis not present

## 2023-08-07 DIAGNOSIS — M25562 Pain in left knee: Secondary | ICD-10-CM | POA: Diagnosis not present

## 2023-08-07 DIAGNOSIS — M25662 Stiffness of left knee, not elsewhere classified: Secondary | ICD-10-CM | POA: Diagnosis not present

## 2023-08-07 DIAGNOSIS — R29898 Other symptoms and signs involving the musculoskeletal system: Secondary | ICD-10-CM | POA: Diagnosis not present

## 2023-08-07 DIAGNOSIS — Z96652 Presence of left artificial knee joint: Secondary | ICD-10-CM | POA: Diagnosis not present

## 2023-08-07 MED ORDER — OXYCODONE HCL 5 MG PO TABS
5.0000 mg | ORAL_TABLET | ORAL | 0 refills | Status: DC | PRN
  Filled 2023-08-07: qty 42, 7d supply, fill #0

## 2023-08-10 DIAGNOSIS — R29898 Other symptoms and signs involving the musculoskeletal system: Secondary | ICD-10-CM | POA: Diagnosis not present

## 2023-08-10 DIAGNOSIS — R269 Unspecified abnormalities of gait and mobility: Secondary | ICD-10-CM | POA: Diagnosis not present

## 2023-08-10 DIAGNOSIS — M25562 Pain in left knee: Secondary | ICD-10-CM | POA: Diagnosis not present

## 2023-08-10 DIAGNOSIS — M25662 Stiffness of left knee, not elsewhere classified: Secondary | ICD-10-CM | POA: Diagnosis not present

## 2023-08-12 DIAGNOSIS — M25662 Stiffness of left knee, not elsewhere classified: Secondary | ICD-10-CM | POA: Diagnosis not present

## 2023-08-12 DIAGNOSIS — R269 Unspecified abnormalities of gait and mobility: Secondary | ICD-10-CM | POA: Diagnosis not present

## 2023-08-12 DIAGNOSIS — R29898 Other symptoms and signs involving the musculoskeletal system: Secondary | ICD-10-CM | POA: Diagnosis not present

## 2023-08-12 DIAGNOSIS — M25562 Pain in left knee: Secondary | ICD-10-CM | POA: Diagnosis not present

## 2023-08-14 DIAGNOSIS — M25562 Pain in left knee: Secondary | ICD-10-CM | POA: Diagnosis not present

## 2023-08-14 DIAGNOSIS — R269 Unspecified abnormalities of gait and mobility: Secondary | ICD-10-CM | POA: Diagnosis not present

## 2023-08-14 DIAGNOSIS — R29898 Other symptoms and signs involving the musculoskeletal system: Secondary | ICD-10-CM | POA: Diagnosis not present

## 2023-08-14 DIAGNOSIS — M25662 Stiffness of left knee, not elsewhere classified: Secondary | ICD-10-CM | POA: Diagnosis not present

## 2023-08-18 DIAGNOSIS — M25562 Pain in left knee: Secondary | ICD-10-CM | POA: Diagnosis not present

## 2023-08-18 DIAGNOSIS — M25662 Stiffness of left knee, not elsewhere classified: Secondary | ICD-10-CM | POA: Diagnosis not present

## 2023-08-18 DIAGNOSIS — R269 Unspecified abnormalities of gait and mobility: Secondary | ICD-10-CM | POA: Diagnosis not present

## 2023-08-18 DIAGNOSIS — R29898 Other symptoms and signs involving the musculoskeletal system: Secondary | ICD-10-CM | POA: Diagnosis not present

## 2023-08-20 DIAGNOSIS — I129 Hypertensive chronic kidney disease with stage 1 through stage 4 chronic kidney disease, or unspecified chronic kidney disease: Secondary | ICD-10-CM | POA: Diagnosis not present

## 2023-08-20 DIAGNOSIS — D6869 Other thrombophilia: Secondary | ICD-10-CM | POA: Diagnosis not present

## 2023-08-20 DIAGNOSIS — I4891 Unspecified atrial fibrillation: Secondary | ICD-10-CM | POA: Diagnosis not present

## 2023-08-20 DIAGNOSIS — N529 Male erectile dysfunction, unspecified: Secondary | ICD-10-CM | POA: Diagnosis not present

## 2023-08-20 DIAGNOSIS — R269 Unspecified abnormalities of gait and mobility: Secondary | ICD-10-CM | POA: Diagnosis not present

## 2023-08-20 DIAGNOSIS — R29898 Other symptoms and signs involving the musculoskeletal system: Secondary | ICD-10-CM | POA: Diagnosis not present

## 2023-08-20 DIAGNOSIS — M1990 Unspecified osteoarthritis, unspecified site: Secondary | ICD-10-CM | POA: Diagnosis not present

## 2023-08-20 DIAGNOSIS — Z6836 Body mass index (BMI) 36.0-36.9, adult: Secondary | ICD-10-CM | POA: Diagnosis not present

## 2023-08-20 DIAGNOSIS — E669 Obesity, unspecified: Secondary | ICD-10-CM | POA: Diagnosis not present

## 2023-08-20 DIAGNOSIS — M25562 Pain in left knee: Secondary | ICD-10-CM | POA: Diagnosis not present

## 2023-08-20 DIAGNOSIS — K219 Gastro-esophageal reflux disease without esophagitis: Secondary | ICD-10-CM | POA: Diagnosis not present

## 2023-08-20 DIAGNOSIS — M25662 Stiffness of left knee, not elsewhere classified: Secondary | ICD-10-CM | POA: Diagnosis not present

## 2023-08-20 DIAGNOSIS — N181 Chronic kidney disease, stage 1: Secondary | ICD-10-CM | POA: Diagnosis not present

## 2023-08-20 DIAGNOSIS — N400 Enlarged prostate without lower urinary tract symptoms: Secondary | ICD-10-CM | POA: Diagnosis not present

## 2023-08-20 DIAGNOSIS — Z791 Long term (current) use of non-steroidal anti-inflammatories (NSAID): Secondary | ICD-10-CM | POA: Diagnosis not present

## 2023-08-24 DIAGNOSIS — R29898 Other symptoms and signs involving the musculoskeletal system: Secondary | ICD-10-CM | POA: Diagnosis not present

## 2023-08-24 DIAGNOSIS — M25662 Stiffness of left knee, not elsewhere classified: Secondary | ICD-10-CM | POA: Diagnosis not present

## 2023-08-24 DIAGNOSIS — M25562 Pain in left knee: Secondary | ICD-10-CM | POA: Diagnosis not present

## 2023-08-24 DIAGNOSIS — R269 Unspecified abnormalities of gait and mobility: Secondary | ICD-10-CM | POA: Diagnosis not present

## 2023-08-26 DIAGNOSIS — R29898 Other symptoms and signs involving the musculoskeletal system: Secondary | ICD-10-CM | POA: Diagnosis not present

## 2023-08-26 DIAGNOSIS — M25562 Pain in left knee: Secondary | ICD-10-CM | POA: Diagnosis not present

## 2023-08-26 DIAGNOSIS — R269 Unspecified abnormalities of gait and mobility: Secondary | ICD-10-CM | POA: Diagnosis not present

## 2023-08-26 DIAGNOSIS — M25662 Stiffness of left knee, not elsewhere classified: Secondary | ICD-10-CM | POA: Diagnosis not present

## 2023-08-31 DIAGNOSIS — M25662 Stiffness of left knee, not elsewhere classified: Secondary | ICD-10-CM | POA: Diagnosis not present

## 2023-08-31 DIAGNOSIS — R269 Unspecified abnormalities of gait and mobility: Secondary | ICD-10-CM | POA: Diagnosis not present

## 2023-08-31 DIAGNOSIS — R29898 Other symptoms and signs involving the musculoskeletal system: Secondary | ICD-10-CM | POA: Diagnosis not present

## 2023-08-31 DIAGNOSIS — M25562 Pain in left knee: Secondary | ICD-10-CM | POA: Diagnosis not present

## 2023-09-02 DIAGNOSIS — M25562 Pain in left knee: Secondary | ICD-10-CM | POA: Diagnosis not present

## 2023-09-02 DIAGNOSIS — R269 Unspecified abnormalities of gait and mobility: Secondary | ICD-10-CM | POA: Diagnosis not present

## 2023-09-02 DIAGNOSIS — M25662 Stiffness of left knee, not elsewhere classified: Secondary | ICD-10-CM | POA: Diagnosis not present

## 2023-09-02 DIAGNOSIS — R29898 Other symptoms and signs involving the musculoskeletal system: Secondary | ICD-10-CM | POA: Diagnosis not present

## 2023-09-07 DIAGNOSIS — R29898 Other symptoms and signs involving the musculoskeletal system: Secondary | ICD-10-CM | POA: Diagnosis not present

## 2023-09-07 DIAGNOSIS — M25562 Pain in left knee: Secondary | ICD-10-CM | POA: Diagnosis not present

## 2023-09-07 DIAGNOSIS — M25662 Stiffness of left knee, not elsewhere classified: Secondary | ICD-10-CM | POA: Diagnosis not present

## 2023-09-07 DIAGNOSIS — R269 Unspecified abnormalities of gait and mobility: Secondary | ICD-10-CM | POA: Diagnosis not present

## 2023-09-09 DIAGNOSIS — M25662 Stiffness of left knee, not elsewhere classified: Secondary | ICD-10-CM | POA: Diagnosis not present

## 2023-09-09 DIAGNOSIS — R29898 Other symptoms and signs involving the musculoskeletal system: Secondary | ICD-10-CM | POA: Diagnosis not present

## 2023-09-09 DIAGNOSIS — M25562 Pain in left knee: Secondary | ICD-10-CM | POA: Diagnosis not present

## 2023-09-09 DIAGNOSIS — R269 Unspecified abnormalities of gait and mobility: Secondary | ICD-10-CM | POA: Diagnosis not present

## 2023-09-11 DIAGNOSIS — Z471 Aftercare following joint replacement surgery: Secondary | ICD-10-CM | POA: Diagnosis not present

## 2023-09-11 DIAGNOSIS — Z96651 Presence of right artificial knee joint: Secondary | ICD-10-CM | POA: Diagnosis not present

## 2023-09-15 DIAGNOSIS — M25362 Other instability, left knee: Secondary | ICD-10-CM | POA: Diagnosis not present

## 2023-09-15 DIAGNOSIS — R269 Unspecified abnormalities of gait and mobility: Secondary | ICD-10-CM | POA: Diagnosis not present

## 2023-09-15 DIAGNOSIS — R29898 Other symptoms and signs involving the musculoskeletal system: Secondary | ICD-10-CM | POA: Diagnosis not present

## 2023-09-15 DIAGNOSIS — M25662 Stiffness of left knee, not elsewhere classified: Secondary | ICD-10-CM | POA: Diagnosis not present

## 2023-09-15 DIAGNOSIS — M25562 Pain in left knee: Secondary | ICD-10-CM | POA: Diagnosis not present

## 2023-09-22 NOTE — Progress Notes (Signed)
 Sent message, via epic in basket, requesting orders in epic from Careers adviser.

## 2023-09-22 NOTE — Progress Notes (Signed)
 Request sent to Dr. Fidel to send pre op orders for PST appointment 09/24/23

## 2023-09-22 NOTE — Patient Instructions (Signed)
 SURGICAL WAITING ROOM VISITATION  Patients having surgery or a procedure may have no more than 2 support people in the waiting area - these visitors may rotate.    Children under the age of 2 must have an adult with them who is not the patient.  Visitors with respiratory illnesses are discouraged from visiting and should remain at home.  If the patient needs to stay at the hospital during part of their recovery, the visitor guidelines for inpatient rooms apply. Pre-op nurse will coordinate an appropriate time for 1 support person to accompany patient in pre-op.  This support person may not rotate.    Please refer to the [facility] website for the visitor guidelines for Inpatients (after your surgery is over and you are in a regular room).       Your procedure is scheduled on: 09/30/23   Report to [facility] Main Entrance    Report to admitting at 11:45 AM   Call this number if you have problems the morning of surgery [phone]   Do not eat food :After Midnight.   After Midnight you may have the following liquids until ______ AM/ PM DAY OF SURGERY  Water  Non-Citrus Juices (without pulp, NO RED-Apple, White grape, White cranberry) Black Coffee (NO MILK/CREAM OR CREAMERS, sugar ok)  Clear Tea (NO MILK/CREAM OR CREAMERS, sugar ok) regular and decaf                             Plain Jell-O (NO RED)                                           Fruit ices (not with fruit pulp, NO RED)                                     Popsicles (NO RED)                                                               Sports drinks like Gatorade (NO RED)                  The day of surgery:  Drink ONE (1) Pre-Surgery Clear Ensure  at AM the morning of surgery. Drink in one sitting. Do not sip.  This drink was given to you during your hospital  pre-op appointment visit. Nothing else to drink after completing the  Pre-Surgery Clear Ensure          If you have questions, please contact your  surgeon's office.    Oral Hygiene is also important to reduce your risk of infection.                                    Remember - BRUSH YOUR TEETH THE MORNING OF SURGERY WITH YOUR REGULAR TOOTHPASTE   Stop all vitamins and herbal supplements 7 days before surgery.   Take these medicines the morning of surgery with A SIP OF WATER : omeprazole (prilosec)  You may not have any metal on your body including hair pins, jewelry, and body piercing             Do not wear make-up, lotions, powders, perfumes/cologne, or deodorant  Do not wear nail polish including gel and S&S, artificial/acrylic nails, or any other type of covering on natural nails including finger and toenails. If you have artificial nails, gel coating, etc. that needs to be removed by a nail salon please have this removed prior to surgery or surgery may need to be canceled/ delayed if the surgeon/ anesthesia feels like they are unable to be safely monitored.   Do not shave  48 hours prior to surgery.               Men may shave face and neck.   Do not bring valuables to the hospital. [name]             RESPONSIBLE   FOR VALUABLES.   Contacts, glasses, dentures or bridgework may not be worn into surgery.   Bring small overnight bag day of surgery.   DO NOT BRING YOUR HOME MEDICATIONS TO THE HOSPITAL. PHARMACY WILL DISPENSE MEDICATIONS LISTED ON YOUR MEDICATION LIST TO YOU DURING YOUR ADMISSION IN THE HOSPITAL!    Patients discharged on the day of surgery will not be allowed to drive home.  Someone NEEDS to stay with you for the first 24 hours after anesthesia.   Special Instructions: Bring a copy of your healthcare power of attorney and living will documents the day of surgery if you haven't scanned them before.              Please read over the following fact sheets you were given: IF YOU HAVE QUESTIONS ABOUT YOUR PRE-OP INSTRUCTIONS PLEASE CALL [phone] Robert Knapp   If you received a COVID test  during your pre-op visit  it is requested that you wear a mask when out in public, stay away from anyone that may not be feeling well and notify your surgeon if you develop symptoms. If you test positive for Covid or have been in contact with anyone that has tested positive in the last 10 days please notify you surgeon.    [facility] - Preparing for Surgery Before surgery, you can play an important role.  Because skin is not sterile, your skin needs to be as free of germs as possible.  You can reduce the number of germs on your skin by washing with CHG (chlorahexidine gluconate) soap before surgery.  CHG is an antiseptic cleaner which kills germs and bonds with the skin to continue killing germs even after washing. Please DO NOT use if you have an allergy to CHG or antibacterial soaps.  If your skin becomes reddened/irritated stop using the CHG and inform your nurse when you arrive at Short Stay. Do not shave (including legs and underarms) for at least 48 hours prior to the first CHG shower.  You may shave your face/neck.  Please follow these instructions carefully:  1.  Shower with CHG Soap the night before surgery and the  morning of surgery.  2.  If you choose to wash your hair, wash your hair first as usual with your normal  shampoo.  3.  After you shampoo, rinse your hair and body thoroughly to remove the shampoo.  4.  Use CHG as you would any other liquid soap.  You can apply chg directly to the skin and wash.  Gently with a scrungie or clean washcloth.  5.  Apply the CHG Soap to your body ONLY FROM THE NECK DOWN.   Do   not use on face/ open                           Wound or open sores. Avoid contact with eyes, ears mouth and   genitals (private parts).                       Wash face,  Genitals (private parts) with your normal soap.             6.  Wash thoroughly, paying special attention to the area where your    surgery  will be performed.  7.  Thoroughly rinse  your body with warm water  from the neck down.  8.  DO NOT shower/wash with your normal soap after using and rinsing off the CHG Soap.                9.  Pat yourself dry with a clean towel.            10.  Wear clean pajamas.            11.  Place clean sheets on your bed the night of your first shower and do not  sleep with pets. Day of Surgery : Do not apply any lotions/deodorants the morning of surgery.  Please wear clean clothes to the hospital/surgery center.  FAILURE TO FOLLOW THESE INSTRUCTIONS MAY RESULT IN THE CANCELLATION OF YOUR SURGERY  PATIENT SIGNATURE_________________________________  NURSE SIGNATURE__________________________________  ________________________________________________________________________

## 2023-09-22 NOTE — Progress Notes (Signed)
 COVID Vaccine received:  []  No [x]  Yes Date of any COVID positive Test in last 90 days: no PCP - Vicenta Saras MD Cardiologist -   Chest x-ray -  EKG -   Stress Test -  ECHO -  Cardiac Cath -   Bowel Prep - [x]  No  []   Yes ______  Pacemaker / ICD device [x]  No []  Yes   Spinal Cord Stimulator:[x]  No []  Yes       History of Sleep Apnea? []  No []  Yes   CPAP used?- [x]  No []  Yes    Does the patient monitor blood sugar?          [x]  No []  Yes  []  N/A  Patient has: [x]  NO Hx DM   []  Pre-DM                 []  DM1  []   DM2 Does patient have a [employer] or Dexacom? []  No []  Yes   Fasting Blood Sugar Ranges-  Checks Blood Sugar _____ times a day  GLP1 agonist / usual dose - no GLP1 instructions:  SGLT-2 inhibitors / usual dose - no SGLT-2 instructions:   Blood Thinner / Instructions:no Aspirin  Instructions:no  Comments:   Activity level: Patient is  unable to climb a flight of stairs without difficulty; [x]  No CP  [x]  No SOB, but would have _knee dysfunction__   Patient can / can not perform ADLs without assistance.   Anesthesia review:   Patient denies shortness of breath, fever, cough and chest pain at PAT appointment.  Patient verbalized understanding and agreement to the Pre-Surgical Instructions that were given to them at this PAT appointment. Patient was also educated of the need to review these PAT instructions again prior to his/her surgery.I reviewed the appropriate phone numbers to call if they have any and questions or concerns.

## 2023-09-23 ENCOUNTER — Ambulatory Visit: Payer: Self-pay | Admitting: Student

## 2023-09-24 ENCOUNTER — Encounter (HOSPITAL_COMMUNITY)
Admission: RE | Admit: 2023-09-24 | Discharge: 2023-09-24 | Disposition: A | Discharge status: Home / Self Care | Source: Ambulatory Visit | Attending: Orthopedic Surgery | Admitting: Orthopedic Surgery

## 2023-09-24 ENCOUNTER — Other Ambulatory Visit: Payer: Self-pay

## 2023-09-24 VITALS — BP 135/89 | HR 72 | Temp 98.2°F | Resp 16 | Ht 74.0 in | Wt 280.0 lb

## 2023-09-24 DIAGNOSIS — Z01812 Encounter for preprocedural laboratory examination: Secondary | ICD-10-CM | POA: Diagnosis present

## 2023-09-24 DIAGNOSIS — Z01818 Encounter for other preprocedural examination: Secondary | ICD-10-CM

## 2023-09-24 LAB — CBC
HCT: 40.1 % (ref 39.0–52.0)
Hemoglobin: 13.3 g/dL (ref 13.0–17.0)
MCH: 32 pg (ref 26.0–34.0)
MCHC: 33.2 g/dL (ref 30.0–36.0)
MCV: 96.6 fL (ref 80.0–100.0)
Platelets: 370 10*3/uL (ref 150–400)
RBC: 4.15 MIL/uL — ABNORMAL LOW (ref 4.22–5.81)
RDW: 13.2 % (ref 11.5–15.5)
WBC: 5.4 10*3/uL (ref 4.0–10.5)
nRBC: 0 % (ref 0.0–0.2)

## 2023-09-30 ENCOUNTER — Other Ambulatory Visit: Payer: Self-pay

## 2023-09-30 ENCOUNTER — Ambulatory Visit (HOSPITAL_COMMUNITY)
Admission: RE | Admit: 2023-09-30 | Discharge: 2023-10-02 | Disposition: A | Discharge status: Home / Self Care | Attending: Orthopedic Surgery | Admitting: Orthopedic Surgery

## 2023-09-30 ENCOUNTER — Ambulatory Visit (HOSPITAL_COMMUNITY): Payer: Self-pay

## 2023-09-30 ENCOUNTER — Encounter (HOSPITAL_COMMUNITY)
Admission: RE | Disposition: A | Discharge status: Home / Self Care | Payer: Self-pay | Source: Home / Self Care | Attending: Orthopedic Surgery

## 2023-09-30 ENCOUNTER — Encounter (HOSPITAL_COMMUNITY): Payer: Self-pay | Admitting: Orthopedic Surgery

## 2023-09-30 ENCOUNTER — Ambulatory Visit: Payer: Self-pay | Admitting: Student

## 2023-09-30 ENCOUNTER — Ambulatory Visit (HOSPITAL_BASED_OUTPATIENT_CLINIC_OR_DEPARTMENT_OTHER): Payer: Self-pay

## 2023-09-30 DIAGNOSIS — S82002A Unspecified fracture of left patella, initial encounter for closed fracture: Secondary | ICD-10-CM | POA: Insufficient documentation

## 2023-09-30 DIAGNOSIS — S86812A Strain of other muscle(s) and tendon(s) at lower leg level, left leg, initial encounter: Secondary | ICD-10-CM | POA: Insufficient documentation

## 2023-09-30 DIAGNOSIS — G8918 Other acute postprocedural pain: Secondary | ICD-10-CM | POA: Diagnosis not present

## 2023-09-30 DIAGNOSIS — W19XXXA Unspecified fall, initial encounter: Secondary | ICD-10-CM | POA: Insufficient documentation

## 2023-09-30 DIAGNOSIS — M9712XA Periprosthetic fracture around internal prosthetic left knee joint, initial encounter: Secondary | ICD-10-CM | POA: Insufficient documentation

## 2023-09-30 DIAGNOSIS — Z96652 Presence of left artificial knee joint: Secondary | ICD-10-CM | POA: Insufficient documentation

## 2023-09-30 DIAGNOSIS — M66262 Spontaneous rupture of extensor tendons, left lower leg: Secondary | ICD-10-CM | POA: Diagnosis not present

## 2023-09-30 DIAGNOSIS — S86812D Strain of other muscle(s) and tendon(s) at lower leg level, left leg, subsequent encounter: Secondary | ICD-10-CM

## 2023-09-30 HISTORY — PX: ORIF PATELLA: SHX5033

## 2023-09-30 SURGERY — OPEN REDUCTION INTERNAL FIXATION (ORIF) PATELLA
Anesthesia: Regional | Site: Knee | Laterality: Left

## 2023-09-30 MED ORDER — ACETAMINOPHEN 500 MG PO TABS
1000.0000 mg | ORAL_TABLET | Freq: Once | ORAL | Status: AC
  Administered 2023-09-30: 1000 mg via ORAL
  Filled 2023-09-30: qty 2

## 2023-09-30 MED ORDER — MENTHOL 3 MG MT LOZG: 1.0000 | LOZENGE | OROMUCOSAL | Status: DC | PRN

## 2023-09-30 MED ORDER — CEFAZOLIN SODIUM-DEXTROSE 3-4 GM/150ML-% IV SOLN
3.0000 g | INTRAVENOUS | Status: AC
  Administered 2023-09-30: 3 g via INTRAVENOUS
  Filled 2023-09-30: qty 150

## 2023-09-30 MED ORDER — FENTANYL CITRATE (PF) 100 MCG/2ML IJ SOLN
INTRAMUSCULAR | Status: AC
Start: 2023-09-30 — End: 2023-09-30
  Filled 2023-09-30: qty 2

## 2023-09-30 MED ORDER — LIDOCAINE HCL (CARDIAC) PF 100 MG/5ML IV SOSY
PREFILLED_SYRINGE | INTRAVENOUS | Status: DC | PRN
  Administered 2023-09-30: 60 mg via INTRATRACHEAL

## 2023-09-30 MED ORDER — FENTANYL CITRATE PF 50 MCG/ML IJ SOSY
25.0000 ug | PREFILLED_SYRINGE | INTRAMUSCULAR | Status: DC | PRN
  Administered 2023-09-30 (×3): 50 ug via INTRAVENOUS

## 2023-09-30 MED ORDER — HYDROMORPHONE HCL 1 MG/ML IJ SOLN: 0.5000 mg | INTRAMUSCULAR | Status: DC | PRN

## 2023-09-30 MED ORDER — PHENYLEPHRINE 80 MCG/ML (10ML) SYRINGE FOR IV PUSH (FOR BLOOD PRESSURE SUPPORT)
PREFILLED_SYRINGE | INTRAVENOUS | Status: DC | PRN
  Administered 2023-09-30: 80 ug via INTRAVENOUS
  Administered 2023-09-30: 120 ug via INTRAVENOUS
  Administered 2023-09-30 (×2): 80 ug via INTRAVENOUS
  Administered 2023-09-30: 120 ug via INTRAVENOUS

## 2023-09-30 MED ORDER — PANTOPRAZOLE SODIUM 40 MG PO TBEC
40.0000 mg | DELAYED_RELEASE_TABLET | Freq: Every day | ORAL | Status: DC
  Administered 2023-10-01 – 2023-10-02 (×2): 40 mg via ORAL
  Filled 2023-09-30 (×3): qty 1

## 2023-09-30 MED ORDER — ALUM & MAG HYDROXIDE-SIMETH 200-200-20 MG/5ML PO SUSP: 30.0000 mL | ORAL | Status: DC | PRN

## 2023-09-30 MED ORDER — POVIDONE-IODINE 10 % EX SWAB: 2.0000 | Freq: Once | CUTANEOUS | Status: DC

## 2023-09-30 MED ORDER — ACETAMINOPHEN 325 MG PO TABS: 325.0000 mg | ORAL_TABLET | Freq: Four times a day (QID) | ORAL | Status: DC | PRN

## 2023-09-30 MED ORDER — OXYCODONE HCL 5 MG PO TABS
10.0000 mg | ORAL_TABLET | ORAL | Status: DC | PRN
  Administered 2023-10-01: 15 mg via ORAL
  Filled 2023-09-30: qty 3

## 2023-09-30 MED ORDER — ONDANSETRON HCL 4 MG/2ML IJ SOLN
INTRAMUSCULAR | Status: DC | PRN
  Administered 2023-09-30: 4 mg via INTRAVENOUS

## 2023-09-30 MED ORDER — PHENYLEPHRINE 80 MCG/ML (10ML) SYRINGE FOR IV PUSH (FOR BLOOD PRESSURE SUPPORT)
PREFILLED_SYRINGE | INTRAVENOUS | Status: AC
  Filled 2023-09-30: qty 10

## 2023-09-30 MED ORDER — METHOCARBAMOL 1000 MG/10ML IJ SOLN: 500.0000 mg | Freq: Four times a day (QID) | INTRAMUSCULAR | Status: DC | PRN

## 2023-09-30 MED ORDER — DOCUSATE SODIUM 100 MG PO CAPS
100.0000 mg | ORAL_CAPSULE | Freq: Two times a day (BID) | ORAL | Status: DC
  Administered 2023-09-30 – 2023-10-02 (×4): 100 mg via ORAL
  Filled 2023-09-30 (×4): qty 1

## 2023-09-30 MED ORDER — DIPHENHYDRAMINE HCL 12.5 MG/5ML PO ELIX: 12.5000 mg | ORAL_SOLUTION | ORAL | Status: DC | PRN

## 2023-09-30 MED ORDER — ACETAMINOPHEN 500 MG PO TABS
1000.0000 mg | ORAL_TABLET | Freq: Four times a day (QID) | ORAL | Status: DC
Start: 2023-10-01 — End: 2023-10-02
  Administered 2023-10-01 – 2023-10-02 (×2): 1000 mg via ORAL
  Filled 2023-09-30 (×2): qty 2

## 2023-09-30 MED ORDER — LACTATED RINGERS IV SOLN: INTRAVENOUS | Status: DC

## 2023-09-30 MED ORDER — METHOCARBAMOL 500 MG PO TABS
500.0000 mg | ORAL_TABLET | Freq: Four times a day (QID) | ORAL | Status: DC | PRN
Start: 2023-09-30 — End: 2023-10-02
  Administered 2023-10-01: 500 mg via ORAL
  Filled 2023-09-30: qty 1

## 2023-09-30 MED ORDER — CEFAZOLIN SODIUM-DEXTROSE 2-4 GM/100ML-% IV SOLN
2.0000 g | Freq: Four times a day (QID) | INTRAVENOUS | Status: AC
  Administered 2023-09-30 – 2023-10-01 (×2): 2 g via INTRAVENOUS
  Filled 2023-09-30 (×2): qty 100

## 2023-09-30 MED ORDER — BUPIVACAINE-EPINEPHRINE (PF) 0.5% -1:200000 IJ SOLN
INTRAMUSCULAR | Status: DC | PRN
Start: 2023-09-30 — End: 2023-09-30
  Administered 2023-09-30: 30 mL via PERINEURAL

## 2023-09-30 MED ORDER — DEXAMETHASONE SODIUM PHOSPHATE 10 MG/ML IJ SOLN
INTRAMUSCULAR | Status: DC | PRN
  Administered 2023-09-30: 5 mg via INTRAVENOUS

## 2023-09-30 MED ORDER — ASPIRIN 81 MG PO CHEW
81.0000 mg | CHEWABLE_TABLET | Freq: Two times a day (BID) | ORAL | Status: DC
  Administered 2023-09-30 – 2023-10-02 (×4): 81 mg via ORAL
  Filled 2023-09-30 (×4): qty 1

## 2023-09-30 MED ORDER — KETOROLAC TROMETHAMINE 15 MG/ML IJ SOLN
7.5000 mg | Freq: Four times a day (QID) | INTRAMUSCULAR | Status: AC
  Administered 2023-09-30 – 2023-10-01 (×4): 7.5 mg via INTRAVENOUS
  Filled 2023-09-30 (×4): qty 1

## 2023-09-30 MED ORDER — 0.9 % SODIUM CHLORIDE (POUR BTL) OPTIME
TOPICAL | Status: DC | PRN
  Administered 2023-09-30: 1000 mL

## 2023-09-30 MED ORDER — ONDANSETRON HCL 4 MG PO TABS: 4.0000 mg | ORAL_TABLET | Freq: Four times a day (QID) | ORAL | Status: DC | PRN

## 2023-09-30 MED ORDER — STERILE WATER FOR IRRIGATION IR SOLN
Status: DC | PRN
  Administered 2023-09-30: 1000 mL

## 2023-09-30 MED ORDER — ADULT MULTIVITAMIN W/MINERALS CH
1.0000 | ORAL_TABLET | Freq: Every day | ORAL | Status: DC
  Administered 2023-10-01 – 2023-10-02 (×2): 1 via ORAL
  Filled 2023-09-30 (×2): qty 1

## 2023-09-30 MED ORDER — AMISULPRIDE (ANTIEMETIC) 5 MG/2ML IV SOLN: 10.0000 mg | Freq: Once | INTRAVENOUS | Status: DC | PRN

## 2023-09-30 MED ORDER — SENNA 8.6 MG PO TABS
1.0000 | ORAL_TABLET | Freq: Two times a day (BID) | ORAL | Status: DC
  Administered 2023-10-01 – 2023-10-02 (×3): 8.6 mg via ORAL
  Filled 2023-09-30 (×4): qty 1

## 2023-09-30 MED ORDER — ISOPROPYL ALCOHOL 70 % SOLN
Status: DC | PRN
  Administered 2023-09-30: 1 via TOPICAL

## 2023-09-30 MED ORDER — MIDAZOLAM HCL 2 MG/2ML IJ SOLN
0.5000 mg | Freq: Once | INTRAMUSCULAR | Status: AC
  Administered 2023-09-30: 1 mg via INTRAVENOUS
  Filled 2023-09-30: qty 2

## 2023-09-30 MED ORDER — PHENOL 1.4 % MT LIQD: 1.0000 | OROMUCOSAL | Status: DC | PRN

## 2023-09-30 MED ORDER — METOCLOPRAMIDE HCL 5 MG PO TABS: 5.0000 mg | ORAL_TABLET | Freq: Three times a day (TID) | ORAL | Status: DC | PRN

## 2023-09-30 MED ORDER — ONDANSETRON HCL 4 MG/2ML IJ SOLN
INTRAMUSCULAR | Status: AC
  Filled 2023-09-30: qty 2

## 2023-09-30 MED ORDER — ONDANSETRON HCL 4 MG/2ML IJ SOLN: 4.0000 mg | Freq: Once | INTRAMUSCULAR | Status: DC | PRN

## 2023-09-30 MED ORDER — FENTANYL CITRATE PF 50 MCG/ML IJ SOSY
25.0000 ug | PREFILLED_SYRINGE | Freq: Once | INTRAMUSCULAR | Status: DC
  Filled 2023-09-30: qty 1

## 2023-09-30 MED ORDER — PROPOFOL 10 MG/ML IV BOLUS
INTRAVENOUS | Status: DC | PRN
  Administered 2023-09-30: 200 mg via INTRAVENOUS

## 2023-09-30 MED ORDER — OXYCODONE HCL 5 MG PO TABS
5.0000 mg | ORAL_TABLET | ORAL | Status: DC | PRN
  Administered 2023-10-01 – 2023-10-02 (×2): 10 mg via ORAL
  Filled 2023-09-30 (×2): qty 2

## 2023-09-30 MED ORDER — FENTANYL CITRATE PF 50 MCG/ML IJ SOSY
PREFILLED_SYRINGE | INTRAMUSCULAR | Status: AC
  Filled 2023-09-30: qty 3

## 2023-09-30 MED ORDER — PHENYLEPHRINE 80 MCG/ML (10ML) SYRINGE FOR IV PUSH (FOR BLOOD PRESSURE SUPPORT)
PREFILLED_SYRINGE | INTRAVENOUS | Status: AC
  Filled 2023-09-30: qty 40

## 2023-09-30 MED ORDER — SODIUM CHLORIDE 0.9 % IV SOLN: INTRAVENOUS | Status: DC

## 2023-09-30 MED ORDER — METOCLOPRAMIDE HCL 5 MG/ML IJ SOLN: 5.0000 mg | Freq: Three times a day (TID) | INTRAMUSCULAR | Status: DC | PRN

## 2023-09-30 MED ORDER — FUROSEMIDE 40 MG PO TABS
40.0000 mg | ORAL_TABLET | Freq: Every day | ORAL | Status: DC
  Administered 2023-10-01 – 2023-10-02 (×2): 40 mg via ORAL
  Filled 2023-09-30 (×3): qty 1

## 2023-09-30 MED ORDER — PROPOFOL 10 MG/ML IV BOLUS
INTRAVENOUS | Status: AC
  Filled 2023-09-30: qty 20

## 2023-09-30 MED ORDER — POLYETHYLENE GLYCOL 3350 17 G PO PACK: 17.0000 g | PACK | Freq: Every day | ORAL | Status: DC | PRN

## 2023-09-30 MED ORDER — CHLORHEXIDINE GLUCONATE 0.12 % MT SOLN
15.0000 mL | Freq: Once | OROMUCOSAL | Status: AC
  Administered 2023-09-30: 15 mL via OROMUCOSAL

## 2023-09-30 MED ORDER — SODIUM CHLORIDE 0.9 % IR SOLN
Status: DC | PRN
  Administered 2023-09-30: 1000 mL

## 2023-09-30 MED ORDER — BISACODYL 10 MG RE SUPP: 10.0000 mg | Freq: Every day | RECTAL | Status: DC | PRN

## 2023-09-30 MED ORDER — FENTANYL CITRATE (PF) 100 MCG/2ML IJ SOLN
INTRAMUSCULAR | Status: DC | PRN
  Administered 2023-09-30 (×4): 50 ug via INTRAVENOUS

## 2023-09-30 MED ORDER — ORAL CARE MOUTH RINSE: 15.0000 mL | Freq: Once | OROMUCOSAL | Status: AC

## 2023-09-30 MED ORDER — TRANEXAMIC ACID-NACL 1000-0.7 MG/100ML-% IV SOLN
1000.0000 mg | INTRAVENOUS | Status: AC
  Administered 2023-09-30: 1000 mg via INTRAVENOUS
  Filled 2023-09-30: qty 100

## 2023-09-30 MED ORDER — ONDANSETRON HCL 4 MG/2ML IJ SOLN
4.0000 mg | Freq: Four times a day (QID) | INTRAMUSCULAR | Status: DC | PRN
  Administered 2023-10-01: 4 mg via INTRAVENOUS
  Filled 2023-09-30: qty 2

## 2023-09-30 SURGICAL SUPPLY — 60 items
BAG COUNTER SPONGE SURGICOUNT (BAG) IMPLANT
BAG ZIPLOCK 12X15 (MISCELLANEOUS) ×1 IMPLANT
BIT DRILL 2XNS DISP SS SM FRAG (BIT) IMPLANT
BNDG ELASTIC 6X15 VLCR STRL LF (GAUZE/BANDAGES/DRESSINGS) IMPLANT
BNDG GAUZE DERMACEA FLUFF 4 (GAUZE/BANDAGES/DRESSINGS) ×1 IMPLANT
BNDG PLASTER X FAST 4X5 WHT LF (CAST SUPPLIES) IMPLANT
BNDG PLASTER X FAST 6X5 WHT LF (CAST SUPPLIES) IMPLANT
BNDG STRETCH 4X75 STRL LF (GAUZE/BANDAGES/DRESSINGS) IMPLANT
CHLORAPREP W/TINT 26 (MISCELLANEOUS) ×1 IMPLANT
COVER SURGICAL LIGHT HANDLE (MISCELLANEOUS) ×1 IMPLANT
CUFF TOURN SGL QUICK 42 (TOURNIQUET CUFF) IMPLANT
CUFF TRNQT CYL 34X4.125X (TOURNIQUET CUFF) IMPLANT
DERMABOND ADVANCED .7 DNX12 (GAUZE/BANDAGES/DRESSINGS) ×1 IMPLANT
DRAPE C-ARM 42X120 X-RAY (DRAPES) ×1 IMPLANT
DRAPE C-ARMOR (DRAPES) ×1 IMPLANT
DRAPE IMP U-DRAPE 54X76 (DRAPES) ×1 IMPLANT
DRAPE INCISE IOBAN 66X45 STRL (DRAPES) ×3 IMPLANT
DRAPE SHEET LG 3/4 BI-LAMINATE (DRAPES) ×3 IMPLANT
DRAPE U-SHAPE 47X51 STRL (DRAPES) ×1 IMPLANT
DRESSING PREVENA PLUS CUSTOM (GAUZE/BANDAGES/DRESSINGS) IMPLANT
DRSG AQUACEL AG ADV 3.5X 6 (GAUZE/BANDAGES/DRESSINGS) ×1 IMPLANT
ELECT BLADE TIP CTD 4 INCH (ELECTRODE) IMPLANT
ELECT REM PT RETURN 15FT ADLT (MISCELLANEOUS) ×1 IMPLANT
GAUZE SPONGE 4X4 12PLY STRL (GAUZE/BANDAGES/DRESSINGS) ×1 IMPLANT
GLOVE BIO SURGEON STRL SZ8.5 (GLOVE) ×2 IMPLANT
GLOVE BIOGEL M 7.0 STRL (GLOVE) ×1 IMPLANT
GLOVE BIOGEL PI IND STRL 7.5 (GLOVE) ×1 IMPLANT
GLOVE BIOGEL PI IND STRL 8 (GLOVE) ×1 IMPLANT
GLOVE BIOGEL PI IND STRL 8.5 (GLOVE) ×1 IMPLANT
GLOVE SURG LX STRL 7.5 STRW (GLOVE) ×2 IMPLANT
GOWN SPEC L3 XXLG W/TWL (GOWN DISPOSABLE) ×1 IMPLANT
KIT DRSG PREVENA PLUS 7DAY 125 (MISCELLANEOUS) IMPLANT
KIT TURNOVER KIT A (KITS) ×1 IMPLANT
MANIFOLD NEPTUNE II (INSTRUMENTS) ×1 IMPLANT
MARKER SKIN DUAL TIP RULER LAB (MISCELLANEOUS) ×1 IMPLANT
NDL MAYO 6 CRC TAPER PT (NEEDLE) IMPLANT
NDL SUT 6 .5 CRC .975X.05 MAYO (NEEDLE) IMPLANT
NEEDLE MAYO 6 CRC TAPER PT (NEEDLE) ×1 IMPLANT
NS IRRIG 1000ML POUR BTL (IV SOLUTION) ×1 IMPLANT
PACK TOTAL KNEE CUSTOM (KITS) ×1 IMPLANT
PADDING CAST ABS COTTON 6X4 NS (CAST SUPPLIES) IMPLANT
PROTECTOR NERVE ULNAR (MISCELLANEOUS) ×1 IMPLANT
RETRIEVER SUT HEWSON (MISCELLANEOUS) ×1 IMPLANT
SET HNDPC FAN SPRY TIP SCT (DISPOSABLE) IMPLANT
SOLUTION PRONTOSAN WOUND 350ML (IRRIGATION / IRRIGATOR) IMPLANT
SPIKE FLUID TRANSFER (MISCELLANEOUS) ×1 IMPLANT
STAPLER SKIN PROX 35W (STAPLE) ×1 IMPLANT
SUT BROADBAND TAPE 2PK 1.5 (SUTURE) ×1 IMPLANT
SUT ETHILON 2 0 PSLX (SUTURE) IMPLANT
SUT MON AB 2-0 CT1 36 (SUTURE) ×1 IMPLANT
SUT VIC AB 0 CT1 27XBRD ANTBC (SUTURE) IMPLANT
SUT VIC AB 0 CT1 36 (SUTURE) IMPLANT
SUT VIC AB 1 CT1 27XBRD ANTBC (SUTURE) IMPLANT
SUT VIC AB 1 CT1 36 (SUTURE) ×1 IMPLANT
SUT VIC AB 1 CTX36XBRD ANBCTR (SUTURE) IMPLANT
SUT VIC AB 2-0 CT1 TAPERPNT 27 (SUTURE) IMPLANT
SUTURE FIBERWR #2 38 T-5 BLUE (SUTURE) IMPLANT
TOWEL GREEN STERILE FF (TOWEL DISPOSABLE) ×1 IMPLANT
WATER STERILE IRR 1000ML POUR (IV SOLUTION) ×1 IMPLANT
WRAP KNEE MAXI GEL POST OP (GAUZE/BANDAGES/DRESSINGS) IMPLANT

## 2023-09-30 NOTE — Anesthesia Procedure Notes (Signed)
 Anesthesia Regional Block: Femoral nerve block   Pre-Anesthetic Checklist: , timeout performed,  Correct Patient, Correct Site, Correct Laterality,  Correct Procedure,, site marked,  Risks and benefits discussed,  Surgical consent,  Pre-op evaluation,  At surgeon's request and post-op pain management  Laterality: Left  Prep: chloraprep       Needles:  Injection technique: Single-shot  Needle Type: Echogenic Stimulator Needle     Needle Length: 10cm  Needle Gauge: 20     Additional Needles:   Procedures:, nerve stimulator,,, ultrasound used (permanent image in chart), intact distal pulses,     Nerve Stimulator or Paresthesia:  Response: Quadriceps muscle contraction, 0.4 mA  Additional Responses:   Narrative:  Start time: 09/30/2023 1:50 PM End time: 09/30/2023 2:00 PM Injection made incrementally with aspirations every 5 mL.  Performed by: Personally  Anesthesiologist: Patrisha Bernardino SQUIBB, MD  Additional Notes: Functioning IV was confirmed and monitors were applied.  A 20ga Bbraun echogenic stimulator needle was used. Sterile prep and drape,hand hygiene and sterile gloves were used.  Negative aspiration and negative test dose prior to incremental administration of local anesthetic. The patient tolerated the procedure well.

## 2023-09-30 NOTE — H&P (View-Only) (Signed)
 PREOPERATIVE H&P  Chief Complaint: left knee injury  HPI: Robert Knapp is a 68 y.o. 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  He had a left total knee arthroplasty 07/29/23 and was doing well until 09/15/23 when he had a left knee injury at PT. Patient saw Margette 09/15/23, when he was sent over from PT due to an injury to his left knee. He states that he was on his 10th round of stair strengthening with his left leg when it gave way and caused him to fall. He had immediate pain and swelling in his left knee. He was unable to bear any weight. He was unable to perform straight leg raise with extensor lag.  X-rays of his left knee showed acute periprosthetic patellar tendon rupture / inferior pole patellar avulsion with patella alta. He was placed in [place]. He followed up in office 09/21/23 with the same above findings. Symptoms are rated as moderate to severe, and have been worsening.  This is significantly impairing activities of daily living.  He has elected for surgical management.   Past Medical History:  Diagnosis Date   Allergy    Arthritis    Past Surgical History:  Procedure Laterality Date   COLONOSCOPY  04/14/2016   KNEE ARTHROPLASTY Right 07/24/2022   Procedure: COMPUTER ASSISTED TOTAL KNEE ARTHROPLASTY;  Surgeon: Fidel Rogue, MD;  Location: [facility];  Service: Orthopedics;  Laterality: Right;  160   KNEE ARTHROPLASTY Left 07/29/2023   Procedure: ARTHROPLASTY, KNEE, TOTAL, USING IMAGELESS COMPUTER-ASSISTED NAVIGATION;  Surgeon: Fidel Rogue, MD;  Location: [facility];  Service: Orthopedics;  Laterality: Left;   NO PAST SURGERIES     POLYPECTOMY     Social History   Socioeconomic History   Marital status: Married    Spouse name: Not on file   Number of children: Not on file   Years of education: Not on file   Highest education level: Not on file  Occupational History    Not on file  Tobacco Use   Smoking status: Never   Smokeless tobacco: Never  Vaping Use   Vaping status: Never Used  Substance and Sexual Activity   Alcohol  use: Not Currently   Drug use: Never   Sexual activity: Not Currently  Other Topics Concern   Not on file  Social History Narrative   Not on file   Social Drivers of Health   Financial Resource Strain: Not on file  Food Insecurity: Not on file  Transportation Needs: Not on file  Physical Activity: Not on file  Stress: Not on file  Social Connections: Not on file   Family History  Problem Relation Age of Onset   Heart disease Mother    Colon cancer Mother 60   Colon polyps Neg Hx    Esophageal cancer Neg Hx    Rectal cancer Neg Hx    Stomach cancer Neg Hx    Pancreatic cancer Neg Hx    No Known Allergies Prior to Admission medications   Medication Sig Start Date End Date Taking? Authorizing Provider  celecoxib  (CELEBREX ) 200 MG capsule Take 200 mg by mouth 2 (two) times daily.    [provider]  furosemide  (LASIX ) 40 MG tablet Take 40 mg by mouth daily. 07/07/23   [provider]  methocarbamol  (ROBAXIN ) 500 MG tablet Take 1 tablet (500 mg total) by mouth every 6 (six) hours as needed. Patient not taking: Reported  on 09/22/2023 07/29/23   Leigh Valery RAMAN, PA-C  Multiple Vitamins-Minerals (ALIVE ADULT PREMIUM) CHEW Chew 1 tablet by mouth daily.    [provider]  omeprazole  (PRILOSEC) 20 MG capsule Take 1 capsule (20 mg total) by mouth daily. 07/14/23   Charlanne Groom, MD  ondansetron  (ZOFRAN ) 4 MG tablet Take 1 tablet (4 mg total) by mouth every 8 (eight) hours as needed for nausea or vomiting. 07/29/23 07/28/24  Leigh Valery RAMAN, PA-C  oxyCODONE  (OXY IR/ROXICODONE ) 5 MG immediate release tablet Take 1 tablet (5 mg total) by mouth every 4 (four) hours as needed for knee pain Patient not taking: Reported on 09/22/2023 08/07/23     tadalafil  (CIALIS ) 5 MG tablet Take 5 mg by mouth daily.    [provider]     Positive ROS: All other systems have been reviewed and were otherwise negative with the exception of those mentioned in the HPI and as above.  Physical Exam: General: Alert, no acute distress Respiratory: No cyanosis, no use of accessory musculature GI: No organomegaly, abdomen is soft and non-tender Skin: No lesions in the area of chief complaint Neurologic: Sensation intact distally Psychiatric: Patient is competent for consent with normal mood and affect Lymphatic: No axillary or cervical lymphadenopathy  MUSCULOSKELETAL:   Examination of the left knee he is wearing a knee immobilizer. He has a well healed incision anterior incision. He has a large effusion. Patient has swelling and ecchymosis about the knee. He cannot perform a straight leg raise, he has extensor lag and significant pain. ROM not performed.   Sensory and motor function intact in LE bilaterally. Distal pedal pulses 2+ bilaterally.  Pitting edema in LE bilaterally left greater than right (Pitting pedal edema in LE bilaterally at baseline). Compartments soft.   Assessment: Acute left periprosthetic patellar tendon rupture / inferior pole patella avulsion.   Plan: I discussed the findings with the patient. His knee is unstable and will require surgical fixation for pain control and to allow immediate mobilization out of bed. Plan for extensor mechanism repair left knee.    The risks benefits and alternatives were discussed with the patient including but not limited to the risks of nonoperative treatment, versus surgical intervention including infection, bleeding, nerve injury,  blood clots, cardiopulmonary complications, morbidity, mortality, among others, and they were willing to proceed.    Valery RAMAN Leigh, PA-C    09/30/2023 10:59 AM

## 2023-09-30 NOTE — Anesthesia Preprocedure Evaluation (Addendum)
 Anesthesia Evaluation  Patient identified by MRN, date of birth, ID band Patient awake    Reviewed: Allergy & Precautions, NPO status , Patient's Chart, lab work & pertinent test results  Airway Mallampati: II  TM Distance: >3 FB Neck ROM: Full    Dental  (+) Upper Dentures   Pulmonary neg pulmonary ROS   Pulmonary exam normal        Cardiovascular negative cardio ROS Normal cardiovascular exam     Neuro/Psych negative neurological ROS  negative psych ROS   GI/Hepatic negative GI ROS,,,(+)     substance abuse    Endo/Other  negative endocrine ROS    Renal/GU negative Renal ROS     Musculoskeletal  (+) Arthritis ,    Abdominal  (+) + obese  Peds  Hematology negative hematology ROS (+)   Anesthesia Other Findings Left periprosthetic patella fracture  Reproductive/Obstetrics                              Anesthesia Physical Anesthesia Plan  ASA: 2  Anesthesia Plan: Regional and General   Post-op Pain Management: Regional block*   Induction: Intravenous  PONV Risk Score and Plan: 2 and Ondansetron , Dexamethasone , Midazolam  and Treatment may vary due to age or medical condition  Airway Management Planned: LMA  Additional Equipment:   Intra-op Plan:   Post-operative Plan: Extubation in OR  Informed Consent: I have reviewed the patients History and Physical, chart, labs and discussed the procedure including the risks, benefits and alternatives for the proposed anesthesia with the patient or authorized representative who has indicated his/her understanding and acceptance.     Dental advisory given  Plan Discussed with: CRNA  Anesthesia Plan Comments:          Anesthesia Quick Evaluation

## 2023-09-30 NOTE — H&P (Addendum)
 PREOPERATIVE H&P  Chief Complaint: left knee injury  HPI: Robert Knapp is a 68 y.o. 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  He had a left total knee arthroplasty 07/29/23 and was doing well until 09/15/23 when he had a left knee injury at PT. Patient saw Margette 09/15/23, when he was sent over from PT due to an injury to his left knee. He states that he was on his 10th round of stair strengthening with his left leg when it gave way and caused him to fall. He had immediate pain and swelling in his left knee. He was unable to bear any weight. He was unable to perform straight leg raise with extensor lag.  X-rays of his left knee showed acute periprosthetic patellar tendon rupture / inferior pole patellar avulsion with patella alta. He was placed in [place]. He followed up in office 09/21/23 with the same above findings. Symptoms are rated as moderate to severe, and have been worsening.  This is significantly impairing activities of daily living.  He has elected for surgical management.   Past Medical History:  Diagnosis Date   Allergy    Arthritis    Past Surgical History:  Procedure Laterality Date   COLONOSCOPY  04/14/2016   KNEE ARTHROPLASTY Right 07/24/2022   Procedure: COMPUTER ASSISTED TOTAL KNEE ARTHROPLASTY;  Surgeon: Fidel Rogue, MD;  Location: [facility];  Service: Orthopedics;  Laterality: Right;  160   KNEE ARTHROPLASTY Left 07/29/2023   Procedure: ARTHROPLASTY, KNEE, TOTAL, USING IMAGELESS COMPUTER-ASSISTED NAVIGATION;  Surgeon: Fidel Rogue, MD;  Location: [facility];  Service: Orthopedics;  Laterality: Left;   NO PAST SURGERIES     POLYPECTOMY     Social History   Socioeconomic History   Marital status: Married    Spouse name: Not on file   Number of children: Not on file   Years of education: Not on file   Highest education level: Not on file  Occupational History    Not on file  Tobacco Use   Smoking status: Never   Smokeless tobacco: Never  Vaping Use   Vaping status: Never Used  Substance and Sexual Activity   Alcohol  use: Not Currently   Drug use: Never   Sexual activity: Not Currently  Other Topics Concern   Not on file  Social History Narrative   Not on file   Social Drivers of Health   Financial Resource Strain: Not on file  Food Insecurity: Not on file  Transportation Needs: Not on file  Physical Activity: Not on file  Stress: Not on file  Social Connections: Not on file   Family History  Problem Relation Age of Onset   Heart disease Mother    Colon cancer Mother 60   Colon polyps Neg Hx    Esophageal cancer Neg Hx    Rectal cancer Neg Hx    Stomach cancer Neg Hx    Pancreatic cancer Neg Hx    No Known Allergies Prior to Admission medications   Medication Sig Start Date End Date Taking? Authorizing Provider  celecoxib  (CELEBREX ) 200 MG capsule Take 200 mg by mouth 2 (two) times daily.    [provider]  furosemide  (LASIX ) 40 MG tablet Take 40 mg by mouth daily. 07/07/23   [provider]  methocarbamol  (ROBAXIN ) 500 MG tablet Take 1 tablet (500 mg total) by mouth every 6 (six) hours as needed. Patient not taking: Reported  on 09/22/2023 07/29/23   Leigh Valery RAMAN, PA-C  Multiple Vitamins-Minerals (ALIVE ADULT PREMIUM) CHEW Chew 1 tablet by mouth daily.    [provider]  omeprazole  (PRILOSEC) 20 MG capsule Take 1 capsule (20 mg total) by mouth daily. 07/14/23   Charlanne Groom, MD  ondansetron  (ZOFRAN ) 4 MG tablet Take 1 tablet (4 mg total) by mouth every 8 (eight) hours as needed for nausea or vomiting. 07/29/23 07/28/24  Leigh Valery RAMAN, PA-C  oxyCODONE  (OXY IR/ROXICODONE ) 5 MG immediate release tablet Take 1 tablet (5 mg total) by mouth every 4 (four) hours as needed for knee pain Patient not taking: Reported on 09/22/2023 08/07/23     tadalafil  (CIALIS ) 5 MG tablet Take 5 mg by mouth daily.    [provider]     Positive ROS: All other systems have been reviewed and were otherwise negative with the exception of those mentioned in the HPI and as above.  Physical Exam: General: Alert, no acute distress Respiratory: No cyanosis, no use of accessory musculature GI: No organomegaly, abdomen is soft and non-tender Skin: No lesions in the area of chief complaint Neurologic: Sensation intact distally Psychiatric: Patient is competent for consent with normal mood and affect Lymphatic: No axillary or cervical lymphadenopathy  MUSCULOSKELETAL:   Examination of the left knee he is wearing a knee immobilizer. He has a well healed incision anterior incision. He has a large effusion. Patient has swelling and ecchymosis about the knee. He cannot perform a straight leg raise, he has extensor lag and significant pain. ROM not performed.   Sensory and motor function intact in LE bilaterally. Distal pedal pulses 2+ bilaterally.  Pitting edema in LE bilaterally left greater than right (Pitting pedal edema in LE bilaterally at baseline). Compartments soft.   Assessment: Acute left periprosthetic patellar tendon rupture / inferior pole patella avulsion.   Plan: I discussed the findings with the patient. His knee is unstable and will require surgical fixation for pain control and to allow immediate mobilization out of bed. Plan for extensor mechanism repair left knee.    The risks benefits and alternatives were discussed with the patient including but not limited to the risks of nonoperative treatment, versus surgical intervention including infection, bleeding, nerve injury,  blood clots, cardiopulmonary complications, morbidity, mortality, among others, and they were willing to proceed.    Valery RAMAN Leigh, PA-C    09/30/2023 10:59 AM

## 2023-09-30 NOTE — Op Note (Signed)
 OPERATIVE REPORT   09/30/2023  6:23 PM  PATIENT:  Robert Knapp   SURGEON:  Redell JINNY Shoals, MD  ASSISTANT:  Valery Potters, PA-C.   PREOPERATIVE DIAGNOSIS:  Left periprosthetic patella fracture / extensor mechanism injury.  POSTOPERATIVE DIAGNOSIS:  Same.  PROCEDURE:  Extensor mechanism reconstruction left knee.  ANESTHESIA:   GETA.  ANTIBIOTICS:  3g Ancef .  IMPLANTS: #2 BroadBand Suture.  SPECIMENS:  None.  COMPLICATIONS:  None.  DISPOSITION:  Stable to PACU.  SURGICAL INDICATIONS:  Robert Knapp is a 68 y.o. male who underwent primary left total knee arthroplasty on 07/29/2023.  He was doing well until his knee buckled while doing stair training and physical therapy, and he fell directly onto the left knee.  He had increased pain, inability to weight-bear.  He was found to have periprosthetic distal pole patella avulsion fracture with patella alta.  His extensor mechanism was not functional clinically.  He was indicated for extensor mechanism repair.  The risks, benefits, and alternatives were discussed with the patient preoperatively including but not limited to the risks of infection, bleeding, nerve / blood vessel injury, malunion, nonunion, cardiopulmonary complications, the need for repeat surgery, among others, and the patient was willing to proceed.  PROCEDURE IN DETAIL: Patient was identified in the holding area using 2 identifiers.  The surgical site was marked by myself.  He was taken the operating room, placed supine on the operating table.  General anesthesia was obtained.  Left lower extremity was prepped and draped in normal sterile surgical fashion.  All bony prominences were well-padded.  Timeout was called, verifying side and site of surgery.  He received IV antibiotics within 60 minutes of beginning the procedure.  Using a #10 blade, I sharply excised his previous anterior skin incision.  Full-thickness skin flaps were created using a combination of curved Mayo  scissors and Bovie electrocautery.  The dissection was extremely challenging due to scarring of the skin and subcutaneous tissue down to the actual extensor mechanism.  While performing the dissection over the medial aspect of the patellar retinaculum, there was an inadvertent vertical laceration to the skin from the inside located about 3 fingerbreadths medial to the incision.  I excised this area in an elliptical fashion in a medial to lateral direction in order to remain perpendicular to the incision.  Upon examining the extensor mechanism, the lateral retinaculum was avulsed from the patella from the 2 o'clock position down towards the insertion of the patellar tendon.  The medial retinaculum was torn from the site of the previous mid vastus repair at approximately the 10 o'clock position, which proceeded down towards the distal patella.  His entire mid vastus repair was traumatically dehisced as well.  The distal nose of the patella was fractured and the distal fragment was located within the proximal aspect of the patellar tendon.  Th epatellar button was well fixed in the large proximal fragment.  The fracture site was cleaned with curette.  The knee was irrigated with 3 L of normal saline using pulsatile lavage.  The patella fracture was reduced using a long throw point-to-point clamp.  A total of 3 vertical bone tunnels were made in a retrograde direction using a 2 mm drill bit.  Using a total of 2 #2 broadband sutures, Krakw sutures were placed into the patellar tendon giving a total of 4 suture limbs.  The 4 limbs were passed through the 3 tunnels and the sutures were held under tension.  The knee was  gently flexed and extended to remove any creep from the system.  The knee was then brought into full extension, and the sutures were tightened.  The quality of the repair was exceptionally nice, as his patellar tendon tissue was very healthy.  At this point, I meticulously repaired the medial and lateral  retinacular tears, as well as the traumatic opening of the mid vastus repair using a combination of #1 Vicryl and #2 broadband suture.  The deep dermal tissue was reapproximated with 2-0 Monocryl suture.  The skin was closed with staples proximally and distally.  Over the patellar area including the medial wound, the skin was reapproximated with vertical mattress sutures.  Customizable Prevena negative pressure incisional dressing was applied hooked up to suction at 75 mmHg.  There was excellent seal without any leak.  Bulky dressing was placed utilizing cast padding and a long-leg posterior plaster splint with vertical struts was placed, leaving his foot and ankle free.  Patient was then awakened from anesthesia and taken to the PACU in stable condition.  Sponge, needle, asthma counts were correct in the case x 2.  There were no known complications.

## 2023-09-30 NOTE — Interval H&P Note (Signed)
 History and Physical Interval Note:  09/30/2023 12:12 PM  Robert Knapp  has presented today for surgery, with the diagnosis of Left periprosthetic patella fracture.  The various methods of treatment have been discussed with the patient and family. After consideration of risks, benefits and other options for treatment, the patient has consented to  Procedure(s): OPEN REDUCTION INTERNAL FIXATION (ORIF) PATELLA (Left) as a surgical intervention.  The patient's history has been reviewed, patient examined, no change in status, stable for surgery.  I have reviewed the patient's chart and labs.  Questions were answered to the patient's satisfaction.     Redell PARAS Terryl Molinelli

## 2023-09-30 NOTE — Anesthesia Procedure Notes (Signed)
 Procedure Name: LMA Insertion Date/Time: 09/30/2023 3:03 PM  Performed by: Gladis Honey, CRNAPre-anesthesia Checklist: Patient identified, Emergency Drugs available, Suction available and Patient being monitored Patient Re-evaluated:Patient Re-evaluated prior to induction Oxygen Delivery Method: Circle System Utilized Preoxygenation: Pre-oxygenation with 100% oxygen Induction Type: IV induction Ventilation: Mask ventilation without difficulty LMA: LMA inserted LMA Size: 4.0 Number of attempts: 1 Placement Confirmation: positive ETCO2 Tube secured with: Tape Dental Injury: Teeth and Oropharynx as per pre-operative assessment

## 2023-09-30 NOTE — Transfer of Care (Signed)
 Immediate Anesthesia Transfer of Care Note  Patient: Robert Knapp  Procedure(s) Performed: OPEN REDUCTION INTERNAL FIXATION (ORIF) PATELLA (Left: Knee)  Patient Location: PACU  Anesthesia Type:General  Level of Consciousness: awake, alert , and oriented  Airway & Oxygen Therapy: Patient Spontanous Breathing and Patient connected to face mask oxygen  Post-op Assessment: Report given to RN and Post -op Vital signs reviewed and stable  Post vital signs: Reviewed and stable  Last Vitals:  Vitals Value Taken Time  BP 128/85 09/30/23 18:48  Temp    Pulse 63 09/30/23 18:51  Resp 15 09/30/23 18:51  SpO2 99 % 09/30/23 18:51  Vitals shown include unfiled device data.  Last Pain:  Vitals:   09/30/23 1215  TempSrc: Oral  PainSc: 0-No pain         Complications: No notable events documented.

## 2023-09-30 NOTE — Discharge Instructions (Signed)
 Dr. Redell Shoals Total Joint Specialist [facility] [address], [address] [place], [place] [address] [phone]   POSTOPERATIVE DIRECTIONS   Knee Rehabilitation, Guidelines Following Surgery  Results after knee surgery are often greatly improved when you follow the exercise, range of motion and muscle strengthening exercises prescribed by your doctor. Safety measures are also important to protect the knee from further injury. Any time any of these exercises cause you to have increased pain or swelling in your knee joint, decrease the amount until you are comfortable again and slowly increase them. If you have problems or questions, call your caregiver or physical therapist for advice.   WEIGHT BEARING Weight bearing as tolerated with assist device (walker, cane, etc) as directed, use it as long as suggested by your surgeon or therapist, typically at least 4-6 weeks.   HOME CARE INSTRUCTIONS  Remove items at home which could result in a fall. This includes throw rugs or furniture in walking pathways.  Continue medications as instructed at time of discharge. You may have some home medications which will be placed on hold until you complete the course of blood thinner medication.  Keep dressing and splint on at all times, do not remove your dressing. Keep dressing and splint clean and dry.  Walk with walker as instructed.  You may resume a sexual relationship in one month or when given the OK by your doctor.  Use walker as long as suggested by your caregivers. Avoid periods of inactivity such as sitting longer than an hour when not asleep. This helps prevent blood clots.  You may put full weight on your legs and walk as much as is comfortable.  You may return to work once you are cleared by your doctor.  Do not drive a car for 6 weeks or until released by you surgeon.  Do not drive while taking narcotics.  Wear the elastic stockings for three weeks following  surgery during the day but you may remove then at night. Make sure you keep all of your appointments after your operation with all of your doctors and caregivers. You should call the office at the above phone number and make an appointment for approximately two weeks after the date of your surgery. Do not remove your surgical dressing. Keep dressing clean and dry, do not take tub baths or submerge the dressing. Please pick up a stool softener and laxative for home use as long as you are requiring pain medications. ICE to the affected knee every three hours for 30 minutes at a time and then as needed for pain and swelling.  Continue to use ice on the knee for pain and swelling from surgery. You may notice swelling that will progress down to the foot and ankle.  This is normal after surgery.  Elevate the leg when you are not up walking on it.   It is important for you to complete the blood thinner medication as prescribed by your doctor. Continue to use the breathing machine which will help keep your temperature down.  It is common for your temperature to cycle up and down following surgery, especially at night when you are not up moving around and exerting yourself.  The breathing machine keeps your lungs expanded and your temperature down.  RANGE OF MOTION AND STRENGTHENING EXERCISES  Rehabilitation of the knee is important following a knee injury or an operation. After just a few days of immobilization, the muscles of the thigh which control the  knee become weakened and shrink (atrophy). Knee exercises are designed to build up the tone and strength of the thigh muscles and to improve knee motion. Often times heat used for twenty to thirty minutes before working out will loosen up your tissues and help with improving the range of motion but do not use heat for the first two weeks following surgery. These exercises can be done on a training (exercise) mat, on the floor, on a table or on a bed. Use what ever  works the best and is most comfortable for you Knee exercises include:  Leg Lifts - While your knee is still immobilized in a splint or cast, you can do straight leg raises. Lift the leg to 60 degrees, hold for 3 sec, and slowly lower the leg. Repeat 10-20 times 2-3 times daily. Perform this exercise against resistance later as your knee gets better.  Quad and Hamstring Sets - Tighten up the muscle on the front of the thigh (Quad) and hold for 5-10 sec. Repeat this 10-20 times hourly. Hamstring sets are done by pushing the foot backward against an object and holding for 5-10 sec. Repeat as with quad sets.  A rehabilitation program following serious knee injuries can speed recovery and prevent re-injury in the future due to weakened muscles. Contact your doctor or a physical therapist for more information on knee rehabilitation.   POST-OPERATIVE OPIOID TAPER INSTRUCTIONS: It is important to wean off of your opioid medication as soon as possible. If you do not need pain medication after your surgery it is ok to stop day one. Opioids include: Codeine, Hydrocodone(Norco, Vicodin), Oxycodone (Percocet, oxycontin ) and hydromorphone  amongst others.  Long term and even short term use of opiods can cause: Increased pain response Dependence Constipation Depression Respiratory depression And more.  Withdrawal symptoms can include Flu like symptoms Nausea, vomiting And more Techniques to manage these symptoms Hydrate well Eat regular healthy meals Stay active Use relaxation techniques(deep breathing, meditating, yoga) Do Not substitute Alcohol  to help with tapering If you have been on opioids for less than two weeks and do not have pain than it is ok to stop all together.  Plan to wean off of opioids This plan should start within one week post op of your joint replacement. Maintain the same interval or time between taking each dose and first decrease the dose.  Cut the total daily intake of opioids  by one tablet each day Next start to increase the time between doses. The last dose that should be eliminated is the evening dose.     MAKE SURE YOU:  Understand these instructions.  Will watch your condition.  Will get help right away if you are not doing well or get worse.   - Please charge your Prevena negative pressure incisional dressing nightly.  - Keep your knee straight, do not bend your knee.  - Keep splint and dressing clean and dry.  - Do not remove your splint or dressing.  - You may weight bear as tolerated with walker.  Pick up stool softner and laxative for home use following surgery while on pain medications. Do NOT remove your dressing.  Do not shower. Do not take tub baths or submerge incision under water . Continue to use ice for pain and swelling after surgery. Do not use any lotions or creams on the incision until instructed by your surgeon.

## 2023-10-01 ENCOUNTER — Encounter (HOSPITAL_COMMUNITY): Payer: Self-pay | Admitting: Orthopedic Surgery

## 2023-10-01 DIAGNOSIS — M9712XA Periprosthetic fracture around internal prosthetic left knee joint, initial encounter: Secondary | ICD-10-CM | POA: Diagnosis not present

## 2023-10-01 LAB — BASIC METABOLIC PANEL WITH GFR
Anion gap: 8 (ref 5–15)
BUN: 16 mg/dL (ref 8–23)
CO2: 24 mmol/L (ref 22–32)
Calcium: 8.3 mg/dL — ABNORMAL LOW (ref 8.9–10.3)
Chloride: 100 mmol/L (ref 98–111)
Creatinine, Ser: 0.93 mg/dL (ref 0.61–1.24)
GFR, Estimated: >60 mL/min (ref >60)
Glucose, Bld: 165 mg/dL — ABNORMAL HIGH (ref 70–99)
Potassium: 4.1 mmol/L (ref 3.5–5.1)
Sodium: 132 mmol/L — ABNORMAL LOW (ref 135–145)

## 2023-10-01 LAB — CBC
HCT: 35.6 % — ABNORMAL LOW (ref 39.0–52.0)
Hemoglobin: 11.6 g/dL — ABNORMAL LOW (ref 13.0–17.0)
MCH: 31.4 pg (ref 26.0–34.0)
MCHC: 32.6 g/dL (ref 30.0–36.0)
MCV: 96.5 fL (ref 80.0–100.0)
Platelets: 210 K/uL (ref 150–400)
RBC: 3.69 MIL/uL — ABNORMAL LOW (ref 4.22–5.81)
RDW: 12.9 % (ref 11.5–15.5)
WBC: 6.5 K/uL (ref 4.0–10.5)
nRBC: 0 % (ref 0.0–0.2)

## 2023-10-01 NOTE — Progress Notes (Addendum)
   10/01/23 1558  TOC Brief Assessment  Insurance and Status Reviewed  Patient has primary care physician Yes  Home environment has been reviewed home wit hspouse  Prior level of function: modified independent  Prior/Current Home Services No current home services  Social Drivers of Health Review SDOH reviewed no interventions necessary  Readmission risk has been reviewed Yes  Transition of care needs no transition of care needs at this time   Pt has needed DME in the home.  No therapy planned initially due to immobilization.

## 2023-10-01 NOTE — Plan of Care (Signed)

## 2023-10-01 NOTE — Evaluation (Signed)
 Physical Therapy Evaluation Patient Details Name: Robert Knapp MRN: [id number] DOB: [birth year] Today's Date: 10/01/2023  History of Present Illness  Pt s/p reconstruction following L periprostetic patellar tendon rupture wtih inferior  pole avulsion.  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  Pt should progress well to dc home with family assist.        If plan is discharge home, recommend the following: A little help with walking and/or transfers;A little help with bathing/dressing/bathroom;Assistance with cooking/housework;Assist for transportation;Help with stairs or ramp for entrance   Can travel by private vehicle        Equipment Recommendations None recommended by PT  Recommendations for Other Services  OT consult    Functional Status Assessment Patient has had a recent decline in their functional status and demonstrates the ability to make significant improvements in function in a reasonable and predictable amount of time.     Precautions / Restrictions Precautions Precautions: Fall;Knee;Other (comment) Recall of Precautions/Restrictions: Intact Precaution/Restrictions Comments: NO FLEXION AT L KNEE Required Braces or Orthoses: Splint/Cast Restrictions Weight Bearing Restrictions Per Provider Order: Yes LLE Weight Bearing Per Provider Order: Weight bearing as tolerated      Mobility  Bed Mobility Overal bed mobility: Needs Assistance Bed Mobility: Supine to Sit, Sit to Supine     Supine to sit: Min assist Sit to supine: Min assist   General bed mobility comments: min assist for L LE management only    Transfers Overall transfer level: Needs assistance Equipment used: Rolling walker (2 wheels) Transfers: Sit to/from Stand Sit to Stand: Contact guard assist           General transfer comment: Steady assist with cues for LE  management and use of UEs to self assistq    Ambulation/Gait Ambulation/Gait assistance: Min assist, Contact guard assist Gait Distance (Feet): 140 Feet Assistive device: Rolling walker (2 wheels) Gait Pattern/deviations: Step-to pattern, Step-through pattern, Decreased step length - right, Decreased step length - left, Shuffle, Trunk flexed       General Gait Details: cues for posture, position from RW and initial sequence  Stairs            Wheelchair Mobility     Tilt Bed    Modified Rankin (Stroke Patients Only)       Balance Overall balance assessment: Needs assistance Sitting-balance support: No upper extremity supported, Feet supported Sitting balance-Leahy Scale: Good     Standing balance support: Bilateral upper extremity supported Standing balance-Leahy Scale: Poor                               Pertinent Vitals/Pain Pain Assessment Pain Assessment: No/denies pain    Home Living Family/patient expects to be discharged to:: Private residence Living Arrangements: Spouse/significant other Available Help at Discharge: Family;Available 24 hours/day Type of Home: House Home Access: Stairs to enter Entrance Stairs-Rails: Doctor, general practice of Steps: 5   Home Layout: One level Home Equipment: Agricultural consultant (2 wheels);Cane - single point      Prior Function Prior Level of Function : Independent/Modified Independent;Working/employed;Driving                     Extremity/Trunk Assessment   Upper Extremity Assessment Upper Extremity Assessment: Overall WFL for tasks assessed    Lower Extremity Assessment Lower  Extremity Assessment: LLE deficits/detail LLE Deficits / Details: splint in place    Cervical / Trunk Assessment Cervical / Trunk Assessment: Normal  Communication   Communication Communication: No apparent difficulties    Cognition Arousal: Alert Behavior During Therapy: WFL for tasks  assessed/performed   PT - Cognitive impairments: No apparent impairments                         Following commands: Intact       Cueing Cueing Techniques: Verbal cues     General Comments      Exercises     Assessment/Plan    PT Assessment Patient needs continued PT services  PT Problem List Decreased strength;Decreased range of motion;Decreased activity tolerance;Decreased balance;Decreased mobility;Decreased knowledge of use of DME;Pain       PT Treatment Interventions DME instruction;Gait training;Stair training;Functional mobility training;Therapeutic activities;Therapeutic exercise;Patient/family education    PT Goals (Current goals can be found in the Care Plan section)  Acute Rehab PT Goals Patient Stated Goal: Regain IND PT Goal Formulation: With patient Time For Goal Achievement: 10/08/23 Potential to Achieve Goals: Good    Frequency 7X/week     Co-evaluation               AM-PAC PT 6 Clicks Mobility  Outcome Measure Help needed turning from your back to your side while in a flat bed without using bedrails?: A Little Help needed moving from lying on your back to sitting on the side of a flat bed without using bedrails?: A Little Help needed moving to and from a bed to a chair (including a wheelchair)?: A Little Help needed standing up from a chair using your arms (e.g., wheelchair or bedside chair)?: A Little Help needed to walk in hospital room?: A Little Help needed climbing 3-5 steps with a railing? : A Little 6 Click Score: 18    End of Session Equipment Utilized During Treatment: Gait belt Activity Tolerance: Patient tolerated treatment well Patient left: in bed;with call bell/phone within reach;with bed alarm set;with family/visitor present Nurse Communication: Mobility status PT Visit Diagnosis: Difficulty in walking, not elsewhere classified (R26.2)    Time: 9094-9062 PT Time Calculation (min) (ACUTE ONLY): 32  min   Charges:   PT Evaluation $PT Eval Low Complexity: 1 Low PT Treatments $Gait Training: 8-22 mins PT General Charges $$ ACUTE PT VISIT: 1 Visit         [facility] PT Acute Rehabilitation Services Office [phone]   Tearsa Kowalewski 10/01/2023, 12:19 PM

## 2023-10-01 NOTE — Progress Notes (Signed)
    Subjective:  Patient reports pain as mild to moderate.  Denies N/V/CP/SOB/Abd pain. He reports some nausea yesterday but none this morning. He reports pain controlled.  Discussed Prevena and splint. All questions solicited and answered.  He denies any tingling or numbness in LE bilaterally.  No current discomfort or pain with splint.   Objective:   VITALS:   Vitals:   09/30/23 2004 09/30/23 2210 10/01/23 0112 10/01/23 0609  BP: 121/77 121/65 131/67 110/67  Pulse: (!) 57 65 63 66  Resp: 18 18 18 18   Temp: 97.7 F (36.5 C) 97.6 F (36.4 C) 98.3 F (36.8 C) 98.4 F (36.9 C)  TempSrc: Oral Oral Oral Oral  SpO2: 98% 99% 97% 98%  Weight:      Height:        NAD Neurologically intact ABD soft Neurovascular intact Sensation intact distally Intact pulses distally Dorsiflexion/Plantar flexion intact No cellulitis present Compartment soft Prevena negative pressure incisional dressing C/D/I. no leaks detected.  Posterior splint intact.   Lab Results  Component Value Date   WBC 6.5 10/01/2023   HGB 11.6 (L) 10/01/2023   HCT 35.6 (L) 10/01/2023   MCV 96.5 10/01/2023   PLT 210 10/01/2023   BMET    Component Value Date/Time   NA 132 (L) 10/01/2023 0320   K 4.1 10/01/2023 0320   CL 100 10/01/2023 0320   CO2 24 10/01/2023 0320   GLUCOSE 165 (H) 10/01/2023 0320   BUN 16 10/01/2023 0320   CREATININE 0.93 10/01/2023 0320   CALCIUM 8.3 (L) 10/01/2023 0320   GFRNONAA >60 10/01/2023 0320     Assessment/Plan: 1 Day Post-Op   Principal Problem:   Traumatic rupture of left patellar tendon, subsequent encounter  ABLA. Hemoglobin 11.6. Asymptomatic. Continue to monitor.   WBAT with walker in posterior long leg splint. Do not bend the knee.  DVT ppx: Aspirin , SCDs, TEDS PO pain control PT/OT: To come today.  Dispo:  - D/c home with HEP for now until adequate healing.  - Plan for d/c today vs tomorrow pending progress.  - Upon discharge nurse to convert  house vac suction unit to portable prevena wound vac prior to discharge home. Please do not hook up until patient going home.    Valery GORMAN Potters 10/01/2023, 7:43 AM   [facility]  [place] [address], [place], [place] [address] Phone: [phone] [email or web address] Facebook  [employer]

## 2023-10-01 NOTE — Plan of Care (Signed)
  Problem: Pain Management: Goal: Pain level will decrease with appropriate interventions Outcome: Progressing   Problem: Clinical Measurements: Goal: Postoperative complications will be avoided or minimized Outcome: Progressing   Problem: Activity: Goal: Range of joint motion will improve Outcome: Progressing   Problem: Education: Goal: Knowledge of the prescribed therapeutic regimen will improve Outcome: Progressing   Problem: Safety: Goal: Ability to remain free from injury will improve Outcome: Progressing   Problem: Elimination: Goal: Will not experience complications related to urinary retention Outcome: Progressing

## 2023-10-02 ENCOUNTER — Other Ambulatory Visit (HOSPITAL_COMMUNITY): Payer: Self-pay

## 2023-10-02 DIAGNOSIS — M9712XA Periprosthetic fracture around internal prosthetic left knee joint, initial encounter: Secondary | ICD-10-CM | POA: Diagnosis not present

## 2023-10-02 LAB — BASIC METABOLIC PANEL WITH GFR
Anion gap: 6 (ref 5–15)
BUN: 14 mg/dL (ref 8–23)
CO2: 27 mmol/L (ref 22–32)
Calcium: 8.2 mg/dL — ABNORMAL LOW (ref 8.9–10.3)
Chloride: 105 mmol/L (ref 98–111)
Creatinine, Ser: 0.92 mg/dL (ref 0.61–1.24)
GFR, Estimated: >60 mL/min (ref >60)
Glucose, Bld: 107 mg/dL — ABNORMAL HIGH (ref 70–99)
Potassium: 3.7 mmol/L (ref 3.5–5.1)
Sodium: 138 mmol/L (ref 135–145)

## 2023-10-02 LAB — CBC
HCT: 31.2 % — ABNORMAL LOW (ref 39.0–52.0)
Hemoglobin: 10 g/dL — ABNORMAL LOW (ref 13.0–17.0)
MCH: 31.3 pg (ref 26.0–34.0)
MCHC: 32.1 g/dL (ref 30.0–36.0)
MCV: 97.5 fL (ref 80.0–100.0)
Platelets: 174 K/uL (ref 150–400)
RBC: 3.2 MIL/uL — ABNORMAL LOW (ref 4.22–5.81)
RDW: 13.1 % (ref 11.5–15.5)
WBC: 6.4 K/uL (ref 4.0–10.5)
nRBC: 0 % (ref 0.0–0.2)

## 2023-10-02 MED ORDER — CEFADROXIL 500 MG PO CAPS
500.0000 mg | ORAL_CAPSULE | Freq: Two times a day (BID) | ORAL | 0 refills | Status: AC
  Filled 2023-10-02: qty 28, 14d supply, fill #0

## 2023-10-02 MED ORDER — OXYCODONE HCL 5 MG PO TABS
5.0000 mg | ORAL_TABLET | ORAL | 0 refills | Status: AC | PRN
  Filled 2023-10-02: qty 42, 7d supply, fill #0

## 2023-10-02 MED ORDER — POLYETHYLENE GLYCOL 3350 17 GM/SCOOP PO POWD
17.0000 g | Freq: Every day | ORAL | 0 refills | Status: AC | PRN
  Filled 2023-10-02: qty 238, 14d supply, fill #0

## 2023-10-02 MED ORDER — METHOCARBAMOL 500 MG PO TABS
500.0000 mg | ORAL_TABLET | Freq: Four times a day (QID) | ORAL | 0 refills | Status: AC | PRN
  Filled 2023-10-02: qty 20, 5d supply, fill #0

## 2023-10-02 MED ORDER — DOCUSATE SODIUM 100 MG PO CAPS
100.0000 mg | ORAL_CAPSULE | Freq: Two times a day (BID) | ORAL | 0 refills | Status: AC
  Filled 2023-10-02: qty 60, 30d supply, fill #0

## 2023-10-02 MED ORDER — ASPIRIN 81 MG PO CHEW
81.0000 mg | CHEWABLE_TABLET | Freq: Two times a day (BID) | ORAL | 0 refills | Status: AC
  Filled 2023-10-02: qty 90, 45d supply, fill #0

## 2023-10-02 MED ORDER — SENNA 8.6 MG PO TABS
2.0000 | ORAL_TABLET | Freq: Every day | ORAL | 0 refills | Status: AC
  Filled 2023-10-02: qty 30, 15d supply, fill #0

## 2023-10-02 MED ORDER — ONDANSETRON HCL 4 MG PO TABS
4.0000 mg | ORAL_TABLET | Freq: Three times a day (TID) | ORAL | 0 refills | Status: AC | PRN
  Filled 2023-10-02: qty 30, 10d supply, fill #0

## 2023-10-02 NOTE — Progress Notes (Signed)
 Orthopedic Tech Progress Note Patient Details:  Robert Knapp 01/30/1956 [id number]  Ortho Devices Type of Ortho Device: Crutches Ortho Device/Splint Interventions: Ordered, Adjustment      Robert Knapp E [name] 10/02/2023, 10:06 AM

## 2023-10-02 NOTE — Progress Notes (Signed)
    Subjective:  Patient reports pain as mild to moderate.  Denies N/V/CP/SOB/Abd pain. He reports he is having some more knee pain today, doing better with the medication.  Adjusted splint at ankle this am.  Voiding without difficulty.  He mobilized well with PT yesterday.  Wife at bedside.  All questions solicited and answered.   Objective:   VITALS:   Vitals:   10/01/23 0942 10/01/23 1417 10/01/23 2229 10/02/23 0623  BP: 110/70 104/63 111/70 (!) 110/59  Pulse: 66 63 69 64  Resp: 18 18 16 16   Temp: 98.6 F (37 C) 98.6 F (37 C) 98.9 F (37.2 C) 98.3 F (36.8 C)  TempSrc:   Oral Oral  SpO2: 95% 97% 90% 94%  Weight:      Height:        NAD Neurologically intact ABD soft Neurovascular intact Sensation intact distally Intact pulses distally Dorsiflexion/Plantar flexion intact Compartment soft Prevena incisional dressing C/D/I. no leaks detected.  Long leg splint intact, adjusted around ankle with padding for comfort.   Lab Results  Component Value Date   WBC 6.4 10/02/2023   HGB 10.0 (L) 10/02/2023   HCT 31.2 (L) 10/02/2023   MCV 97.5 10/02/2023   PLT 174 10/02/2023   BMET    Component Value Date/Time   NA 138 10/02/2023 0321   K 3.7 10/02/2023 0321   CL 105 10/02/2023 0321   CO2 27 10/02/2023 0321   GLUCOSE 107 (H) 10/02/2023 0321   BUN 14 10/02/2023 0321   CREATININE 0.92 10/02/2023 0321   CALCIUM 8.2 (L) 10/02/2023 0321   GFRNONAA >60 10/02/2023 0321     Assessment/Plan: 2 Days Post-Op   Principal Problem:   Traumatic rupture of left patellar tendon, subsequent encounter  ABLA hemoglobin 10.0. Asymptomatic, continue to monitor.   WBAT with walker DVT ppx: Aspirin , SCDs, TEDS PO pain control PT/OT: He ambulated 140 feet with PT yesterday. Continue PT today.  Dispo:  - D/c home with HEP for now until adequate healing.  - Upon discharge nurse to convert house vac suction unit to portable prevena wound vac prior to discharge home.  Please do not hook up until patient going home.    Valery GORMAN Potters 10/02/2023, 9:09 AM   [facility]  [place] [address], [address], [place], [place] [address] Phone: [phone] [email or web address] Facebook  [employer]

## 2023-10-02 NOTE — Plan of Care (Signed)
 Problem: Education: Goal: Knowledge of General Education information will improve Description: Including pain rating scale, medication(s)/side effects and non-pharmacologic comfort measures Outcome: Progressing   Problem: Health Behavior/Discharge Planning: Goal: Ability to manage health-related needs will improve Outcome: Progressing   Problem: Clinical Measurements: Goal: Ability to maintain clinical measurements within normal limits will improve Outcome: Progressing   Problem: Activity: Goal: Risk for activity intolerance will decrease Outcome: Progressing   Problem: Elimination: Goal: Will not experience complications related to bowel motility Outcome: Progressing   Problem: Pain Managment: Goal: General experience of comfort will improve and/or be controlled Outcome: Progressing   Jon LULLA Reins, RN 10/02/23 9:47 AM

## 2023-10-02 NOTE — Progress Notes (Signed)
 Physical Therapy Treatment Patient Details Name: Robert Knapp MRN: [id number] DOB: [birth year] Today's Date: 10/02/2023   History of Present Illness Pt s/p reconstruction following L periprostetic patellar tendon rupture wtih inferior  pole avulsion.  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  Spouse present for session.  Questions asked and answered. Pt eager for dc home this date.    If plan is discharge home, recommend the following: A little help with walking and/or transfers;A little help with bathing/dressing/bathroom;Assistance with cooking/housework;Assist for transportation;Help with stairs or ramp for entrance   Can travel by private vehicle        Equipment Recommendations  None recommended by PT    Recommendations for Other Services OT consult     Precautions / Restrictions Precautions Precautions: Fall;Knee;Other (comment) Recall of Precautions/Restrictions: Intact Precaution/Restrictions Comments: NO FLEXION AT L KNEE Required Braces or Orthoses: Splint/Cast Restrictions Weight Bearing Restrictions Per Provider Order: No LLE Weight Bearing Per Provider Order: Weight bearing as tolerated     Mobility  Bed Mobility Overal bed mobility: Needs Assistance Bed Mobility: Supine to Sit, Sit to Supine     Supine to sit: Supervision Sit to supine: Supervision   General bed mobility comments: Increased time with cues and use of gait belt but no physical assist    Transfers Overall transfer level: Needs assistance Equipment used: Rolling walker (2 wheels) Transfers: Sit to/from Stand Sit to Stand: Contact guard assist           General transfer comment: Steady assist with cues for LE management and use of UEs to self assist    Ambulation/Gait Ambulation/Gait assistance: Contact guard  assist, Supervision Gait Distance (Feet): 180 Feet Assistive device: Rolling walker (2 wheels) Gait Pattern/deviations: Step-to pattern, Step-through pattern, Decreased step length - right, Decreased step length - left, Shuffle, Trunk flexed       General Gait Details: cues for posture, position from RW and initial sequence   Stairs Stairs: Yes Stairs assistance: Min assist Stair Management: One rail Right, Step to pattern, Forwards, With walker, With crutches Number of Stairs: 10 General stair comments: 5 stairs with rail and RW; 5 stairs with crutch and rail.  Cues for sequence, spouse assisting   Wheelchair Mobility     Tilt Bed    Modified Rankin (Stroke Patients Only)       Balance Overall balance assessment: Needs assistance Sitting-balance support: No upper extremity supported, Feet supported Sitting balance-Leahy Scale: Good     Standing balance support: No upper extremity supported Standing balance-Leahy Scale: Fair                              Hotel manager: No apparent difficulties  Cognition Arousal: Alert Behavior During Therapy: WFL for tasks assessed/performed   PT - Cognitive impairments: No apparent impairments                         Following commands: Intact      Cueing Cueing Techniques: Verbal cues  Exercises General Exercises - Lower Extremity Ankle Circles/Pumps: AROM, Both, 15 reps, Supine Quad Sets: AROM, Both, 10 reps, Supine Hip ABduction/ADduction: AAROM, Left, 15 reps, Supine Straight Leg Raises: AAROM, Left, 10 reps, Supine    General Comments  Pertinent Vitals/Pain Pain Assessment Pain Assessment: No/denies pain    Home Living                          Prior Function            PT Goals (current goals can now be found in the care plan section) Acute Rehab PT Goals Patient Stated Goal: Regain IND PT Goal Formulation: With patient Time For Goal  Achievement: 10/08/23 Potential to Achieve Goals: Good Progress towards PT goals: Progressing toward goals    Frequency    7X/week      PT Plan      Co-evaluation              AM-PAC PT 6 Clicks Mobility   Outcome Measure  Help needed turning from your back to your side while in a flat bed without using bedrails?: A Little Help needed moving from lying on your back to sitting on the side of a flat bed without using bedrails?: A Little Help needed moving to and from a bed to a chair (including a wheelchair)?: A Little Help needed standing up from a chair using your arms (e.g., wheelchair or bedside chair)?: A Little Help needed to walk in hospital room?: A Little Help needed climbing 3-5 steps with a railing? : A Little 6 Click Score: 18    End of Session Equipment Utilized During Treatment: Gait belt Activity Tolerance: Patient tolerated treatment well Patient left: in bed;with call bell/phone within reach;with bed alarm set;with family/visitor present Nurse Communication: Mobility status PT Visit Diagnosis: Difficulty in walking, not elsewhere classified (R26.2)     Time: 9149-9064 PT Time Calculation (min) (ACUTE ONLY): 45 min  Charges:    $Gait Training: 8-22 mins $Therapeutic Exercise: 8-22 mins $Therapeutic Activity: 8-22 mins PT General Charges $$ ACUTE PT VISIT: 1 Visit                     [facility] PT Acute Rehabilitation Services Office [phone]    [facility] 10/02/2023, 12:38 PM

## 2023-10-02 NOTE — Plan of Care (Signed)
 Patient discharged home via private vehicle with wife. AVS and discharge instruction provided. Patient and wife verbalize understanding. Jon LULLA Reins, RN 10/02/23 1:58 PM

## 2023-10-02 NOTE — Discharge Summary (Signed)
 Physician Discharge Summary  Patient ID: Robert Knapp MRN: [id number] DOB/AGE: [birth year] 68 y.o.  Admit date: 09/30/2023 Discharge date: 10/02/2023  Admission Diagnoses:  Traumatic rupture of left patellar tendon, subsequent encounter  Discharge Diagnoses:  Principal Problem:   Traumatic rupture of left patellar tendon, subsequent encounter   Past Medical History:  Diagnosis Date   Allergy    Arthritis     Surgeries: Procedure(s): OPEN REDUCTION INTERNAL FIXATION (ORIF) PATELLA on 09/30/2023   Consultants (if any):   Discharged Condition: Improved  Hospital Course: Robert Knapp is an 68 y.o. male who was admitted 09/30/2023 with a diagnosis of Traumatic rupture of left patellar tendon, subsequent encounter and went to the operating room on 09/30/2023 and underwent the above named procedures.    He was given perioperative antibiotics:  Anti-infectives (From admission, onward)    Start     Dose/Rate Route Frequency Ordered Stop   10/02/23 0000  cefadroxil  (DURICEF) 500 MG capsule        500 mg Oral 2 times daily 10/02/23 0917 10/16/23 2359   10/01/23 0600  ceFAZolin  (ANCEF ) IVPB 3g/150 mL premix        3 g 300 mL/hr over 30 Minutes Intravenous On call to O.R. 09/30/23 1202 10/01/23 0905   09/30/23 2200  ceFAZolin  (ANCEF ) IVPB 2g/100 mL premix        2 g 200 mL/hr over 30 Minutes Intravenous Every 6 hours 09/30/23 2010 10/01/23 0500       He was given sequential compression devices, early ambulation, and aspirin  for DVT prophylaxis.  POD#1 ABLA hemoglobin 11.6, asymptomatic. He ambulated 140 feet with PT doing well. Prevena negative pressure incisional dressing C/D/I, no leaks detected. Long leg splint intact.  POD#2 Patient doing well. He ambulated well with PT. ABLA hemoglobin 10.0, asymptomatic. Nurse converted house vac suction unit to portable prevena wound vac prior to discharge home. Follow-up within 7 days for removal of Prevena negative pressure incisional  dressing.   He benefited maximally from the hospital stay and there were no complications.    Recent vital signs:  Vitals:   10/01/23 2229 10/02/23 0623  BP: 111/70 (!) 110/59  Pulse: 69 64  Resp: 16 16  Temp: 98.9 F (37.2 C) 98.3 F (36.8 C)  SpO2: 90% 94%    Recent laboratory studies:  Lab Results  Component Value Date   HGB 10.0 (L) 10/02/2023   HGB 11.6 (L) 10/01/2023   HGB 13.3 09/24/2023   Lab Results  Component Value Date   WBC 6.4 10/02/2023   PLT 174 10/02/2023   No results found for: INR Lab Results  Component Value Date   NA 138 10/02/2023   K 3.7 10/02/2023   CL 105 10/02/2023   CO2 27 10/02/2023   BUN 14 10/02/2023   CREATININE 0.92 10/02/2023   GLUCOSE 107 (H) 10/02/2023     Allergies as of 10/02/2023   No Known Allergies      Medication List     TAKE these medications    acetaminophen  325 MG tablet Commonly known as: TYLENOL  Take 650 mg by mouth every 6 (six) hours as needed for moderate pain (pain score 4-6).   Alive Adult Premium Chew Chew 1 tablet by mouth daily.   aspirin  81 MG chewable tablet Commonly known as: Aspirin  Childrens Chew 1 tablet (81 mg total) by mouth 2 (two) times daily with a meal.   cefadroxil  500 MG capsule Commonly known as: DURICEF Take 1 capsule (500 mg  total) by mouth 2 (two) times daily for 14 days.   celecoxib  200 MG capsule Commonly known as: CELEBREX  Take 200 mg by mouth 2 (two) times daily.   docusate sodium  100 MG capsule Commonly known as: Colace Take 1 capsule (100 mg total) by mouth 2 (two) times daily.   furosemide  40 MG tablet Commonly known as: LASIX  Take 40 mg by mouth daily.   methocarbamol  500 MG tablet Commonly known as: ROBAXIN  Take 1 tablet (500 mg total) by mouth every 6 (six) hours as needed.   omeprazole  20 MG capsule Commonly known as: PRILOSEC Take 1 capsule (20 mg total) by mouth daily.   ondansetron  4 MG tablet Commonly known as: Zofran  Take 1 tablet (4 mg  total) by mouth every 8 (eight) hours as needed for nausea or vomiting.   oxyCODONE  5 MG immediate release tablet Commonly known as: Roxicodone  Take 1 tablet (5 mg total) by mouth every 4 (four) hours as needed for severe pain (pain score 7-10). What changed: reasons to take this   polyethylene glycol 17 g packet Commonly known as: MiraLax  Take 17 g by mouth daily as needed for mild constipation or moderate constipation.   senna 8.6 MG Tabs tablet Commonly known as: SENOKOT Take 2 tablets (17.2 mg total) by mouth at bedtime for 15 days.   tadalafil  5 MG tablet Commonly known as: CIALIS  Take 5 mg by mouth daily.               Discharge Care Instructions  (From admission, onward)           Start     Ordered   10/02/23 0000  Weight bearing as tolerated        10/02/23 0917   10/02/23 0000  Change dressing       Comments: Do not remove your dressing.   10/02/23 0917              WEIGHT BEARING   Weight bearing as tolerated with assist device (walker, cane, etc) as directed, use it as long as suggested by your surgeon or therapist, typically at least 4-6 weeks. Keep leg straight, do not bend knee.    EXERCISES  Results after joint replacement surgery are often greatly improved when you follow the exercise, range of motion and muscle strengthening exercises prescribed by your doctor. Safety measures are also important to protect the joint from further injury. Any time any of these exercises cause you to have increased pain or swelling, decrease what you are doing until you are comfortable again and then slowly increase them. If you have problems or questions, call your caregiver or physical therapist for advice.   Rehabilitation is important following a joint replacement. After just a few days of immobilization, the muscles of the leg can become weakened and shrink (atrophy).  These exercises are designed to build up the tone and strength of the thigh and leg  muscles and to improve motion. Often times heat used for twenty to thirty minutes before working out will loosen up your tissues and help with improving the range of motion but do not use heat for the first two weeks following surgery (sometimes heat can increase post-operative swelling).   These exercises can be done on a training (exercise) mat, on the floor, on a table or on a bed. Use whatever works the best and is most comfortable for you.    Use music or television while you are exercising so that the exercises are  a pleasant break in your day. This will make your life better with the exercises acting as a break in your routine that you can look forward to.   Perform all exercises about fifteen times, three times per day or as directed.  You should exercise both the operative leg and the other leg as well.  Exercises include:   Quad Sets - Tighten up the muscle on the front of the thigh (Quad) and hold for 5-10 seconds.   Straight Leg Raises - With your knee straight (if you were given a brace, keep it on), lift the leg to 60 degrees, hold for 3 seconds, and slowly lower the leg.  Perform this exercise against resistance later as your leg gets stronger.  Leg Slides: Lying on your back, slowly slide your foot toward your buttocks, bending your knee up off the floor (only go as far as is comfortable). Then slowly slide your foot back down until your leg is flat on the floor again.  Angel Wings: Lying on your back spread your legs to the side as far apart as you can without causing discomfort.  Hamstring Strength:  Lying on your back, push your heel against the floor with your leg straight by tightening up the muscles of your buttocks.  Repeat, but this time bend your knee to a comfortable angle, and push your heel against the floor.  You may put a pillow under the heel to make it more comfortable if necessary.   A rehabilitation program following joint replacement surgery can speed recovery and  prevent re-injury in the future due to weakened muscles. Contact your doctor or a physical therapist for more information on knee rehabilitation.    CONSTIPATION  Constipation is defined medically as fewer than three stools per week and severe constipation as less than one stool per week.  Even if you have a regular bowel pattern at home, your normal regimen is likely to be disrupted due to multiple reasons following surgery.  Combination of anesthesia, postoperative narcotics, change in appetite and fluid intake all can affect your bowels.   YOU MUST use at least one of the following options; they are listed in order of increasing strength to get the job done.  They are all available over the counter, and you may need to use some, POSSIBLY even all of these options:    Drink plenty of fluids (prune juice may be helpful) and high fiber foods Colace 100 mg by mouth twice a day  Senokot for constipation as directed and as needed Dulcolax (bisacodyl ), take with full glass of water   Miralax  (polyethylene glycol) once or twice a day as needed.  If you have tried all these things and are unable to have a bowel movement in the first 3-4 days after surgery call either your surgeon or your primary doctor.    If you experience loose stools or diarrhea, hold the medications until you stool forms back up.  If your symptoms do not get better within 1 week or if they get worse, check with your doctor.  If you experience the worst abdominal pain ever or develop nausea or vomiting, please contact the office immediately for further recommendations for treatment.   ITCHING:  If you experience itching with your medications, try taking only a single pain pill, or even half a pain pill at a time.  You can also use Benadryl  over the counter for itching or also to help with sleep.   TED HOSE STOCKINGS:  Use stockings on both legs until for at least 2 weeks or as directed by physician office. They may be removed at  night for sleeping.  MEDICATIONS:  See your medication summary on the "After Visit Summary" that nursing will review with you.  You may have some home medications which will be placed on hold until you complete the course of blood thinner medication.  It is important for you to complete the blood thinner medication as prescribed.  PRECAUTIONS:  If you experience chest pain or shortness of breath - call 911 immediately for transfer to the hospital emergency department.   If you develop a fever greater that 101 F, purulent drainage from wound, increased redness or drainage from wound, foul odor from the wound/dressing, or calf pain - CONTACT YOUR SURGEON.                                                   FOLLOW-UP APPOINTMENTS:  If you do not already have a post-op appointment, please call the office for an appointment to be seen by your surgeon.  Guidelines for how soon to be seen are listed in your "After Visit Summary", but are typically between 1-4 weeks after surgery.  OTHER INSTRUCTIONS:   Knee Replacement:  Do not place pillow under knee, focus on keeping the knee straight while resting. CPM instructions: 0-90 degrees, 2 hours in the morning, 2 hours in the afternoon, and 2 hours in the evening. Place foam block, curve side up under heel at all times except when in CPM or when walking.  DO NOT modify, tear, cut, or change the foam block in any way.   MAKE SURE YOU:  Understand these instructions.  Get help right away if you are not doing well or get worse.    Thank you for letting us  be a part of your medical care team.  It is a privilege we respect greatly.  We hope these instructions will help you stay on track for a fast and full recovery!   Diagnostic Studies: No results found.  Disposition: Discharge disposition: 01-Home or Self Care       Discharge Instructions     Call MD / Call 911   Complete by: As directed    If you experience chest pain or shortness of breath, CALL  911 and be transported to the hospital emergency room.  If you develope a fever above 101 F, pus (white drainage) or increased drainage or redness at the wound, or calf pain, call your surgeon's office.   Change dressing   Complete by: As directed    Do not remove your dressing.   Constipation Prevention   Complete by: As directed    Drink plenty of fluids.  Prune juice may be helpful.  You may use a stool softener, such as Colace (over the counter) 100 mg twice a day.  Use MiraLax  (over the counter) for constipation as needed.   Diet - low sodium heart healthy   Complete by: As directed    Discharge instructions   Complete by: As directed    Elevate LLE. Do not bend your knee.  Keep splint clean and dry.  Ice for pain and swelling.  Charge prevena portable wound vac nightly.   Do not put a pillow under the knee. Place it under the heel.  Complete by: As directed    Driving restrictions   Complete by: As directed    No driving for 6 weeks   Increase activity slowly as tolerated   Complete by: As directed    Lifting restrictions   Complete by: As directed    No lifting for 6 weeks   Post-operative opioid taper instructions:   Complete by: As directed    POST-OPERATIVE OPIOID TAPER INSTRUCTIONS: It is important to wean off of your opioid medication as soon as possible. If you do not need pain medication after your surgery it is ok to stop day one. Opioids include: Codeine, Hydrocodone(Norco, Vicodin), Oxycodone (Percocet, oxycontin ) and hydromorphone  amongst others.  Long term and even short term use of opiods can cause: Increased pain response Dependence Constipation Depression Respiratory depression And more.  Withdrawal symptoms can include Flu like symptoms Nausea, vomiting And more Techniques to manage these symptoms Hydrate well Eat regular healthy meals Stay active Use relaxation techniques(deep breathing, meditating, yoga) Do Not substitute Alcohol  to help with  tapering If you have been on opioids for less than two weeks and do not have pain than it is ok to stop all together.  Plan to wean off of opioids This plan should start within one week post op of your joint replacement. Maintain the same interval or time between taking each dose and first decrease the dose.  Cut the total daily intake of opioids by one tablet each day Next start to increase the time between doses. The last dose that should be eliminated is the evening dose.      TED hose   Complete by: As directed    Use stockings (TED hose) for 2 weeks on right lower extremity leg(s).  You may remove them at night for sleeping.   Weight bearing as tolerated   Complete by: As directed         Follow-up Information     Leigh Valery RAMAN, PA-C. Schedule an appointment as soon as possible for a visit in 7 day(s).   Specialty: Orthopedic Surgery Why: For removal of Prevena negative pressure incisional dressing and wound check. Contact information: [address] [place] [address] [phone]                  Signed: Valery RAMAN Leigh 10/02/2023, 9:17 AM

## 2023-10-02 NOTE — Progress Notes (Signed)
 Discharge mediations delivered to patient at bedside [facility]

## 2023-10-13 NOTE — Anesthesia Postprocedure Evaluation (Signed)
 Anesthesia Post Note  Patient: Robert Knapp  Procedure(s) Performed: OPEN REDUCTION INTERNAL FIXATION (ORIF) PATELLA (Left: Knee)     Patient location during evaluation: PACU Anesthesia Type: Regional and General Level of consciousness: awake Pain management: pain level controlled Vital Signs Assessment: post-procedure vital signs reviewed and stable Respiratory status: spontaneous breathing, nonlabored ventilation and respiratory function stable Cardiovascular status: blood pressure returned to baseline and stable Postop Assessment: no apparent nausea or vomiting Anesthetic complications: no   No notable events documented.  Last Vitals:  Vitals:   10/01/23 2229 10/02/23 0623  BP: 111/70 (!) 110/59  Pulse: 69 64  Resp: 16 16  Temp: 37.2 C 36.8 C  SpO2: 90% 94%    Last Pain:  Vitals:   10/02/23 1015  TempSrc:   PainSc: 3                  Miley Lindon P Arynn Armand

## 2023-10-26 DIAGNOSIS — S86812D Strain of other muscle(s) and tendon(s) at lower leg level, left leg, subsequent encounter: Secondary | ICD-10-CM | POA: Diagnosis not present

## 2023-10-26 DIAGNOSIS — Z96652 Presence of left artificial knee joint: Secondary | ICD-10-CM | POA: Diagnosis not present

## 2023-11-02 DIAGNOSIS — Z471 Aftercare following joint replacement surgery: Secondary | ICD-10-CM | POA: Diagnosis not present

## 2023-11-02 DIAGNOSIS — S86812D Strain of other muscle(s) and tendon(s) at lower leg level, left leg, subsequent encounter: Secondary | ICD-10-CM | POA: Diagnosis not present

## 2023-11-02 DIAGNOSIS — Z96652 Presence of left artificial knee joint: Secondary | ICD-10-CM | POA: Diagnosis not present

## 2023-11-09 DIAGNOSIS — Z471 Aftercare following joint replacement surgery: Secondary | ICD-10-CM | POA: Diagnosis not present

## 2023-11-09 DIAGNOSIS — S86812D Strain of other muscle(s) and tendon(s) at lower leg level, left leg, subsequent encounter: Secondary | ICD-10-CM | POA: Diagnosis not present

## 2023-11-09 DIAGNOSIS — Z96652 Presence of left artificial knee joint: Secondary | ICD-10-CM | POA: Diagnosis not present

## 2023-12-08 DIAGNOSIS — Z471 Aftercare following joint replacement surgery: Secondary | ICD-10-CM | POA: Diagnosis not present

## 2023-12-08 DIAGNOSIS — Z96652 Presence of left artificial knee joint: Secondary | ICD-10-CM | POA: Diagnosis not present

## 2023-12-08 DIAGNOSIS — S86812D Strain of other muscle(s) and tendon(s) at lower leg level, left leg, subsequent encounter: Secondary | ICD-10-CM | POA: Diagnosis not present

## 2023-12-23 DIAGNOSIS — Z0000 Encounter for general adult medical examination without abnormal findings: Secondary | ICD-10-CM | POA: Diagnosis not present

## 2023-12-23 DIAGNOSIS — Z9181 History of falling: Secondary | ICD-10-CM | POA: Diagnosis not present

## 2023-12-28 ENCOUNTER — Other Ambulatory Visit (HOSPITAL_COMMUNITY): Payer: Self-pay

## 2023-12-28 MED ORDER — CEFADROXIL 500 MG PO CAPS
500.0000 mg | ORAL_CAPSULE | Freq: Two times a day (BID) | ORAL | 0 refills | Status: AC
  Filled 2023-12-28 (×2): qty 60, 30d supply, fill #0

## 2023-12-29 DIAGNOSIS — S86812D Strain of other muscle(s) and tendon(s) at lower leg level, left leg, subsequent encounter: Secondary | ICD-10-CM | POA: Diagnosis not present

## 2023-12-29 DIAGNOSIS — Z471 Aftercare following joint replacement surgery: Secondary | ICD-10-CM | POA: Diagnosis not present

## 2023-12-29 DIAGNOSIS — Z96652 Presence of left artificial knee joint: Secondary | ICD-10-CM | POA: Diagnosis not present

## 2024-05-12 IMAGING — MR MR PROSTATE WO/W CM
12 series · 48 of 48 positions shown · IV contrast (20ml Multihance)
Comparison: None Available.

CLINICAL DATA: Elevated PSA.

EXAM:
MR PROSTATE WITHOUT AND WITH CONTRAST
TECHNIQUE: Multiplanar multisequence MRI images were obtained of the pelvis
centered about the prostate. Pre and post contrast images were
obtained.
CONTRAST:  20mL MULTIHANCE GADOBENATE DIMEGLUMINE 529 MG/ML IV SOLN

[Series 3: T2 · coronal · 3.0mm · 0.56mm/px · 1 of 28 slices shown (1 of 3)]
[im 1/28]
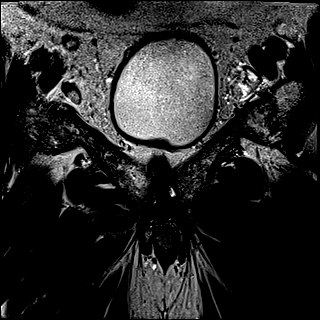

[Series 4: T1 · axial · 5.0mm · 1.25mm/px · 1 of 80 slices shown]
[im 1/80]
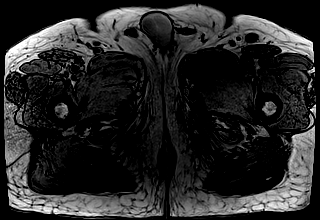

[Series 5: DWI · axial · 3.0mm · 2.11mm/px · z∈[+104,+173]mm · 2 of 72 slices shown (1 of 3)]
[im 1/72]
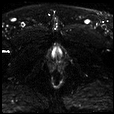
[im 72/72]
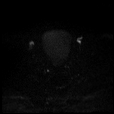

[Series 6: DWI · axial · 3.0mm · 2.11mm/px · 1 of 24 slices shown (2 of 3)]
[im 1/24]
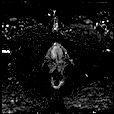

[Series 7: DWI · axial · 3.0mm · 2.11mm/px · 1 of 24 slices shown (3 of 3)]
[im 1/24]
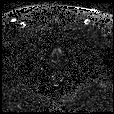

[Series 8: T2 · axial · 3.0mm · 0.62mm/px · 1 of 24 slices shown (2 of 3)]
[im 1/24]
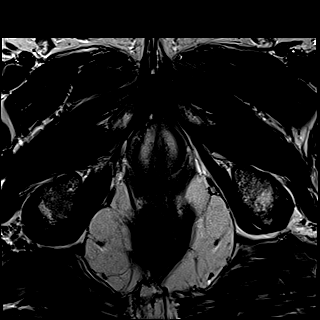

[Series 9: T2 · axial · 1.0mm · 1.15mm/px · z∈[+103,+174]mm · 2 of 72 slices shown (3 of 3)]
[im 1/72]
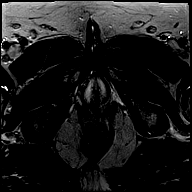
[im 72/72]
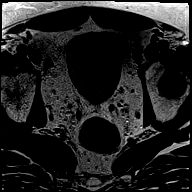

[Series 10: pre t1_twist_tra_dyn · axial · non-contrast · 3.5mm · 1.15mm/px · 1 of 20 slices shown]
[im 1/20]
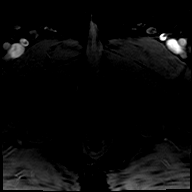

[Series 11: post t1_twist_tra_dyn-copy center · axial · non-contrast · 3.5mm · 1.15mm/px · z∈[+105,+172]mm · 17 of 600 slices shown]
[im 1/600]
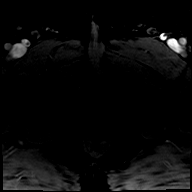
[im 38/600]
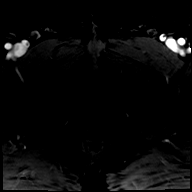
[im 75/600]
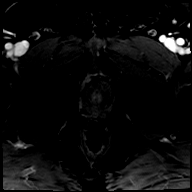
[im 113/600]
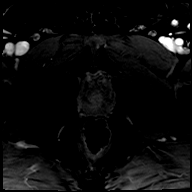
[im 150/600]
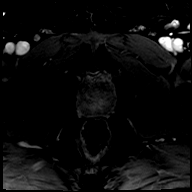
[im 188/600]
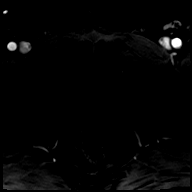
[im 225/600]
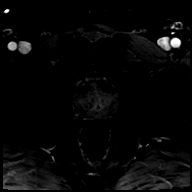
[im 263/600]
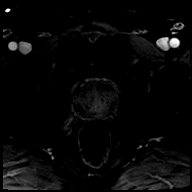
[im 300/600]
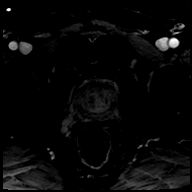
[im 337/600]
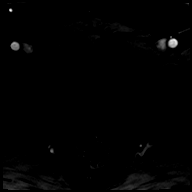
[im 375/600]
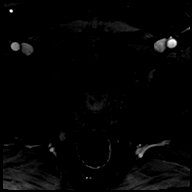
[im 412/600]
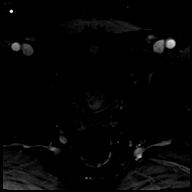
[im 450/600]
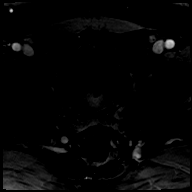
[im 487/600]
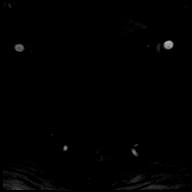
[im 525/600]
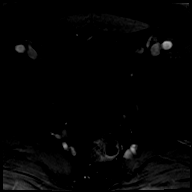
[im 562/600]
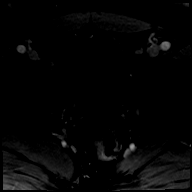
[im 600/600]
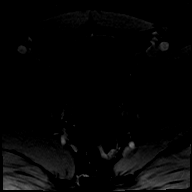

[Series 12: post t1_twist_tra_dyn-copy cent_sub · axial · 3.5mm · 1.15mm/px · z∈[+105,+172]mm · 17 of 580 slices shown]
[im 1/580]
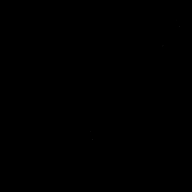
[im 37/580]
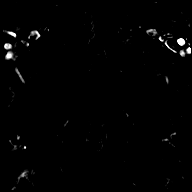
[im 73/580]
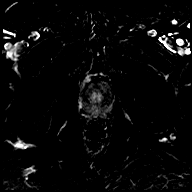
[im 109/580]
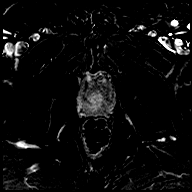
[im 145/580]
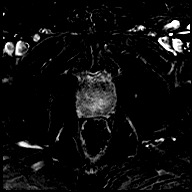
[im 181/580]
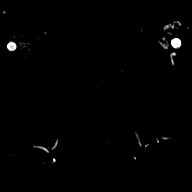
[im 218/580]
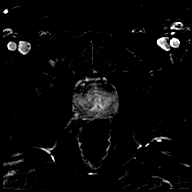
[im 254/580]
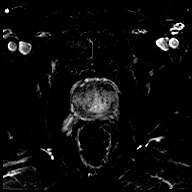
[im 290/580]
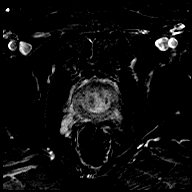
[im 326/580]
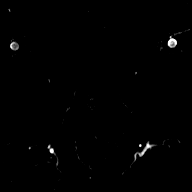
[im 362/580]
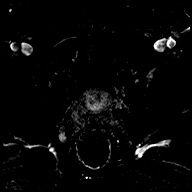
[im 399/580]
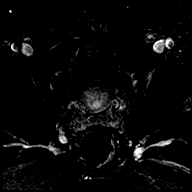
[im 435/580]
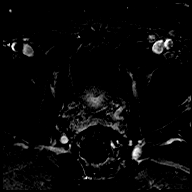
[im 471/580]
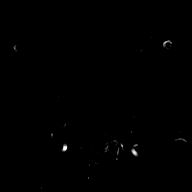
[im 507/580]
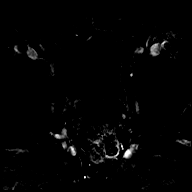
[im 543/580]
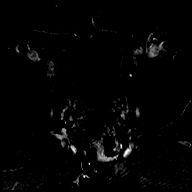
[im 580/580]
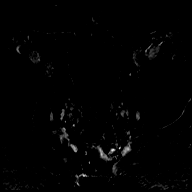

[Series 13: t1_vibe_dixon_tra_f · axial · 2.5mm · 0.91mm/px · z∈[+57,+254]mm · 2 of 80 slices shown]
[im 1/80]
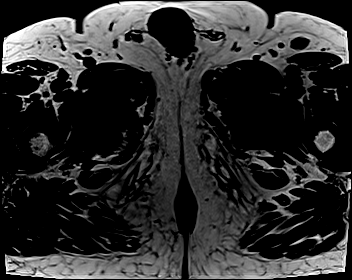
[im 80/80]
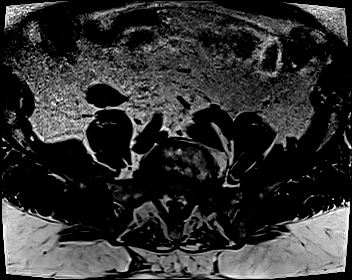

[Series 14: t1_vibe_dixon_tra_w · axial · 2.5mm · 0.91mm/px · z∈[+57,+254]mm · 2 of 80 slices shown]
[im 1/80]
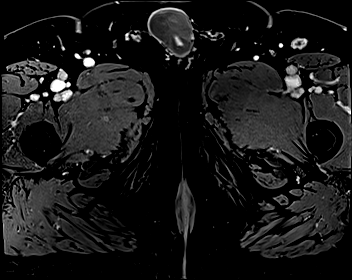
[im 80/80]
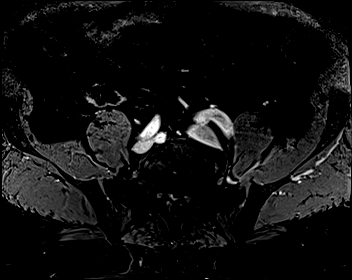

[48 of 48 positions shown; findings below may reference images not displayed]

FINDINGS: Prostate:

-- Peripheral Zone: Diffuse heterogeneous T2 signal intensity is
seen noted. Linear/wedge shaped hypointensities are demonstrated on
ADC; however, no focal ADC hypointense or high b-value DWI
hyperintense nodules are identified.

-- Transition/Central Zone: Mildly enlarged with multiple BPH
nodules. No nodules with suspicious characteristics on T2-weighted
imaging.

-- Measurements/Volume:  4.9 by 4.3 x 5.3 cm (volume = 58 cm^3)

Transcapsular spread:  Absent

Seminal vesicle involvement:  Absent

Neurovascular bundle involvement:  Absent

Pelvic adenopathy: None visualized

Bone metastasis: None visualized

Other:  None
IMPRESSION: No radiographic evidence of high-grade prostate carcinoma. PI-RADS 2
(v.2.1): Low (clinically significant cancer unlikely)
